# Patient Record
Sex: Male | Born: 1970
Health system: Southern US, Community
[De-identification: ages and names within clinical notes are randomized; demographics above are authoritative.]

## PROBLEM LIST (undated history)

## (undated) DIAGNOSIS — I10 Essential (primary) hypertension: Secondary | ICD-10-CM

## (undated) HISTORY — DX: Essential (primary) hypertension: I10

## (undated) HISTORY — PX: VARICOCELE EXCISION: SUR582

---

## 2005-04-02 ENCOUNTER — Ambulatory Visit: Payer: Self-pay

## 2005-04-21 ENCOUNTER — Emergency Department: Payer: Self-pay | Admitting: Unknown Physician Specialty

## 2005-10-22 ENCOUNTER — Ambulatory Visit: Payer: Self-pay | Admitting: Chiropractic Medicine

## 2005-10-24 ENCOUNTER — Emergency Department: Payer: Self-pay | Admitting: Emergency Medicine

## 2005-10-24 ENCOUNTER — Other Ambulatory Visit: Payer: Self-pay

## 2005-11-06 ENCOUNTER — Encounter: Admission: RE | Admit: 2005-11-06 | Discharge: 2005-11-06 | Payer: Self-pay | Admitting: Orthopedic Surgery

## 2005-11-20 ENCOUNTER — Encounter: Admission: RE | Admit: 2005-11-20 | Discharge: 2005-11-20 | Payer: Self-pay | Admitting: Orthopedic Surgery

## 2005-12-03 ENCOUNTER — Encounter: Admission: RE | Admit: 2005-12-03 | Discharge: 2005-12-03 | Payer: Self-pay | Admitting: Orthopedic Surgery

## 2013-05-21 ENCOUNTER — Telehealth: Payer: Self-pay | Admitting: Internal Medicine

## 2013-05-21 NOTE — Telephone Encounter (Signed)
Michael Lane is a patient of yours and he recommended you as a dr for [name]  He has bcbs.  [name] wanted to know if he could be set up as a new patient

## 2013-05-21 NOTE — Telephone Encounter (Signed)
Ok to schedule.

## 2013-05-26 NOTE — Telephone Encounter (Signed)
Patient aware of appointment on 08/21/13

## 2013-08-21 ENCOUNTER — Telehealth: Payer: Self-pay | Admitting: Internal Medicine

## 2013-08-21 ENCOUNTER — Encounter: Payer: Self-pay | Admitting: Internal Medicine

## 2013-08-21 ENCOUNTER — Ambulatory Visit (INDEPENDENT_AMBULATORY_CARE_PROVIDER_SITE_OTHER): Payer: BC Managed Care – PPO | Admitting: Internal Medicine

## 2013-08-21 VITALS — BP 178/140 | HR 84 | Temp 97.8°F | Ht 71.5 in | Wt 237.0 lb

## 2013-08-21 DIAGNOSIS — F17200 Nicotine dependence, unspecified, uncomplicated: Secondary | ICD-10-CM

## 2013-08-21 DIAGNOSIS — Z720 Tobacco use: Secondary | ICD-10-CM

## 2013-08-21 DIAGNOSIS — Z13220 Encounter for screening for lipoid disorders: Secondary | ICD-10-CM

## 2013-08-21 DIAGNOSIS — Z125 Encounter for screening for malignant neoplasm of prostate: Secondary | ICD-10-CM

## 2013-08-21 DIAGNOSIS — R9431 Abnormal electrocardiogram [ECG] [EKG]: Secondary | ICD-10-CM

## 2013-08-21 DIAGNOSIS — I10 Essential (primary) hypertension: Secondary | ICD-10-CM

## 2013-08-21 LAB — CBC WITH DIFFERENTIAL/PLATELET
Basophils Absolute: 0 10*3/uL (ref 0.0–0.1)
Basophils Relative: 0.5 % (ref 0.0–3.0)
Eosinophils Absolute: 0.1 10*3/uL (ref 0.0–0.7)
Lymphocytes Relative: 37 % (ref 12.0–46.0)
MCV: 93.2 fl (ref 78.0–100.0)
Monocytes Relative: 6.7 % (ref 3.0–12.0)
Neutrophils Relative %: 54.1 % (ref 43.0–77.0)
Platelets: 252 10*3/uL (ref 150.0–400.0)
RBC: 5.57 Mil/uL (ref 4.22–5.81)

## 2013-08-21 LAB — LDL CHOLESTEROL, DIRECT: Direct LDL: 160.9 mg/dL

## 2013-08-21 LAB — COMPREHENSIVE METABOLIC PANEL
Albumin: 4.2 g/dL (ref 3.5–5.2)
Calcium: 9.5 mg/dL (ref 8.4–10.5)
Chloride: 102 mEq/L (ref 96–112)
Potassium: 5 mEq/L (ref 3.5–5.1)
Total Bilirubin: 0.6 mg/dL (ref 0.3–1.2)

## 2013-08-21 LAB — URINALYSIS, ROUTINE W REFLEX MICROSCOPIC
Hgb urine dipstick: NEGATIVE
Ketones, ur: NEGATIVE
Leukocytes, UA: NEGATIVE
Nitrite: NEGATIVE
Specific Gravity, Urine: 1.02 (ref 1.000–1.030)
Urine Glucose: NEGATIVE
Urobilinogen, UA: 1 (ref 0.0–1.0)

## 2013-08-21 LAB — LIPID PANEL
Cholesterol: 214 mg/dL — ABNORMAL HIGH (ref 0–200)
Triglycerides: 89 mg/dL (ref 0.0–149.0)

## 2013-08-21 MED ORDER — LISINOPRIL-HYDROCHLOROTHIAZIDE 10-12.5 MG PO TABS: 1.0000 | ORAL_TABLET | Freq: Every day | ORAL | Status: DC

## 2013-08-21 NOTE — Telephone Encounter (Signed)
LVM for patient to call our office in reference to apt scheduled with [facility], [address] [place], on October 28th at 8am.

## 2013-08-23 ENCOUNTER — Encounter: Payer: Self-pay | Admitting: Internal Medicine

## 2013-08-23 DIAGNOSIS — I10 Essential (primary) hypertension: Secondary | ICD-10-CM | POA: Insufficient documentation

## 2013-08-23 DIAGNOSIS — R9431 Abnormal electrocardiogram [ECG] [EKG]: Secondary | ICD-10-CM | POA: Insufficient documentation

## 2013-08-23 DIAGNOSIS — Z720 Tobacco use: Secondary | ICD-10-CM | POA: Insufficient documentation

## 2013-08-23 NOTE — Assessment & Plan Note (Signed)
Discussed the need to quit and treatment options.

## 2013-08-23 NOTE — Assessment & Plan Note (Signed)
Blood pressure significantly elevated.  Currently without symptoms.  EKG obtained given long reported history of untreated elevated blood pressure.  EKG revealed SR with TWI anterolateral.  LVH.  Will obtain ECHO to further evaluate wall thickness, wall motion, heart function and any valve abnormality.  Start lisinopril/hctz 10/12.5 q day.  Get him back in soon to reassess.  Check metabolic panel and urinalysis.

## 2013-08-23 NOTE — Progress Notes (Signed)
  Subjective:    Patient ID: Michael Lane, male    DOB: 1971/09/13, 42 y.o.   MRN: [id number]  HPI 42 year old male with past history of elevated blood pressure on no medications.  He comes in today to follow up on this as well as to establish care.  States he is very active.  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  No chest pain or tightness with increased activity or exertion.  Breathing stable.  No acid reflux.  No nausea or vomiting.  No bowel change.  Does smoke.  Discussed the need to quit.  Discussed treatment options.  No urinary change.  He does report being on blood pressure medication on one occasion.  This affected his ability to sustain/obtain an erection, so he stopped.  Drinks a lot of caffeine.     Past Medical History  Diagnosis Date  . Hypertension     Review of Systems Patient denies any headache, lightheadedness or dizziness.  No sinus or allergy symptoms currently.  No chest pain, tightness or palpitations.  No increased shortness of breath, cough or congestion.  No nausea or vomiting.  No acid reflux.  No abdominal pain or cramping.  No bowel change, such as diarrhea, constipation, BRBPR or melana.  No urine change.   Feels he is handling his work situation well.  Handling stress well.  Smokes.  Discussed stopping and treatment options.       Objective:   Physical Exam Filed Vitals:   08/21/13 0841  BP: 178/140  Pulse: 84  Temp: 97.8 F (36.6 C)   Blood pressure rechecked by me:  178/61-66  42 year old male in no acute distress.  HEENT:  Nares - clear.  Oropharynx - without lesions. NECK:  Supple.  Nontender.  No audible carotid bruit.  HEART:  Appears to be regular.   LUNGS:  No crackles or wheezing audible.  Respirations even and unlabored.   RADIAL PULSE:  Equal bilaterally.  ABDOMEN:  Soft.  Nontender.  Bowel sounds present and normal.  No audible abdominal bruit.    EXTREMITIES:  No increased edema present.  DP pulses palpable and equal  bilaterally.          Assessment & Plan:  HEALTH MAINTENANCE.  Schedule him for a physical next visit.  Check cholesterol.   I spent 45 minutes with the patient and more than 50% of the time was spent in consultation regarding the above.

## 2013-08-23 NOTE — Assessment & Plan Note (Signed)
EKG as outlined.  Obtain ECHO for reasons given.

## 2013-08-25 ENCOUNTER — Encounter: Payer: Self-pay | Admitting: Internal Medicine

## 2013-08-25 ENCOUNTER — Telehealth: Payer: Self-pay | Admitting: Internal Medicine

## 2013-08-25 ENCOUNTER — Other Ambulatory Visit: Payer: Self-pay | Admitting: Internal Medicine

## 2013-08-25 DIAGNOSIS — E875 Hyperkalemia: Secondary | ICD-10-CM

## 2013-08-25 DIAGNOSIS — R945 Abnormal results of liver function studies: Secondary | ICD-10-CM

## 2013-08-25 NOTE — Telephone Encounter (Signed)
Pt was notified of lab results via my chart.  He needs a non fasting lab appt within the next 2 weeks.  Please contact him with an appt date and time.   Thanks.

## 2013-08-25 NOTE — Progress Notes (Signed)
Order placed for f/u labs.  

## 2013-08-26 ENCOUNTER — Encounter: Payer: Self-pay | Admitting: Emergency Medicine

## 2013-08-26 NOTE — Telephone Encounter (Signed)
Mychart mess sent to pt to inform of apt

## 2013-08-26 NOTE — Telephone Encounter (Signed)
Appointment  For labs 11/5 pt aware

## 2013-09-01 ENCOUNTER — Other Ambulatory Visit: Payer: Self-pay

## 2013-09-01 ENCOUNTER — Other Ambulatory Visit (INDEPENDENT_AMBULATORY_CARE_PROVIDER_SITE_OTHER): Payer: BC Managed Care – PPO

## 2013-09-01 DIAGNOSIS — R9431 Abnormal electrocardiogram [ECG] [EKG]: Secondary | ICD-10-CM

## 2013-09-08 ENCOUNTER — Telehealth: Payer: Self-pay | Admitting: Internal Medicine

## 2013-09-08 DIAGNOSIS — I517 Cardiomegaly: Secondary | ICD-10-CM

## 2013-09-08 DIAGNOSIS — R9431 Abnormal electrocardiogram [ECG] [EKG]: Secondary | ICD-10-CM

## 2013-09-08 NOTE — Telephone Encounter (Signed)
Pt notified of echo results and the need for cardiology referral.  He is in agreement.  Order placed for cardiology referral - Dr Kirke Corin.

## 2013-09-09 ENCOUNTER — Other Ambulatory Visit (INDEPENDENT_AMBULATORY_CARE_PROVIDER_SITE_OTHER): Payer: BC Managed Care – PPO

## 2013-09-09 ENCOUNTER — Encounter: Payer: Self-pay | Admitting: Internal Medicine

## 2013-09-09 DIAGNOSIS — R7989 Other specified abnormal findings of blood chemistry: Secondary | ICD-10-CM

## 2013-09-09 DIAGNOSIS — E875 Hyperkalemia: Secondary | ICD-10-CM

## 2013-09-09 DIAGNOSIS — R945 Abnormal results of liver function studies: Secondary | ICD-10-CM

## 2013-09-09 LAB — HEPATIC FUNCTION PANEL: Albumin: 4 g/dL (ref 3.5–5.2)

## 2013-09-09 LAB — HEMOGLOBIN: Hemoglobin: 16.7 g/dL (ref 13.0–17.0)

## 2013-09-10 ENCOUNTER — Encounter: Payer: Self-pay | Admitting: Cardiovascular Disease

## 2013-09-10 ENCOUNTER — Ambulatory Visit (INDEPENDENT_AMBULATORY_CARE_PROVIDER_SITE_OTHER): Payer: BC Managed Care – PPO | Admitting: Cardiovascular Disease

## 2013-09-10 ENCOUNTER — Other Ambulatory Visit: Payer: Self-pay

## 2013-09-10 VITALS — BP 172/123 | HR 82 | Ht 72.0 in | Wt 236.5 lb

## 2013-09-10 DIAGNOSIS — I119 Hypertensive heart disease without heart failure: Secondary | ICD-10-CM

## 2013-09-10 DIAGNOSIS — I10 Essential (primary) hypertension: Secondary | ICD-10-CM

## 2013-09-10 DIAGNOSIS — E785 Hyperlipidemia, unspecified: Secondary | ICD-10-CM

## 2013-09-10 MED ORDER — AMLODIPINE BESYLATE 5 MG PO TABS: 5.0000 mg | ORAL_TABLET | Freq: Every day | ORAL | Status: DC

## 2013-09-10 MED ORDER — LISINOPRIL-HYDROCHLOROTHIAZIDE 20-25 MG PO TABS: 1.0000 | ORAL_TABLET | Freq: Every day | ORAL | Status: DC

## 2013-09-10 NOTE — Assessment & Plan Note (Signed)
Lab Results  Component Value Date   CHOL 214* 08/21/2013   HDL 44.30 08/21/2013   LDLDIRECT 160.9 08/21/2013   TRIG 89.0 08/21/2013   CHOLHDL 5 08/21/2013   Lifestyle changes are recommended for the time being. If no significant improvement, a statin might be indicated.

## 2013-09-10 NOTE — Assessment & Plan Note (Signed)
His echo is highly suggestive of hypertensive heart disease which is concerning in a patient at this age. He does have extensive anterolateral T wave changes. Thus, I will plan on obtaining a stress test once his blood pressure is controlled.

## 2013-09-10 NOTE — Assessment & Plan Note (Signed)
The patient has malignant hypertension with end organ damage with chronic manifestations. It is concerning that his blood pressure is still extremely high. I had a prolonged discussion with him about my concerns of continuously having such high blood pressure and associated morbidity with this. I discussed with him the importance of low sodium diet and decreasing caffeine intake. I also think we have to rule out secondary hypertension given his young age with extremely high blood pressure. His symptoms are not suggestive of pheochromocytoma. Distal pulses are intact and thus coarctation is unlikely. I will obtain blood work including cortisol level and aldosterone to renin ratio. I will also request a renal artery duplex ultrasound to rule out renal artery stenosis.  In the meanwhile, I will double the dose of lisinopril hydrochlorothiazide and add amlodipine 5 mg once daily.  I will have him followup with me after testing.  I reviewed his recent blood work which was unremarkable except for abnormal liver enzymes. He is going to followup with Dr. Lorin Picket about this. He reports no history of hepatitis and no excessive alcohol abuse. He does have tattoos and thus hepatitis C should be evaluated.

## 2013-09-10 NOTE — Patient Instructions (Signed)
Labs today.   Your physician has requested that you have a renal artery duplex. During this test, an ultrasound is used to evaluate blood flow to the kidneys. Allow one hour for this exam. Do not eat after midnight the day before and avoid carbonated beverages. Take your medications as you usually do.  Increase Lisinopril-Hydrochlorothiazide to 20-25 mg once daily.   Start Amlodipine 5 mg once daily.   Follow up after tests.

## 2013-09-10 NOTE — Progress Notes (Signed)
Primary care physician: Dr. Dale Diggins  HPI  This is a 42 year old Caucasian man who was referred by Dr. Lorin Picket for evaluation of hypertension and abnormal EKG. The patient reports having high blood pressure for at least the last 2-3 years. His blood pressure has been consistently above 160 systolic. He was placed on a medication in the past but stopped taking it due to erectile dysfunction. He was started recently on small dose lisinopril hydrochlorothiazide with blood pressure is still very elevated. He is completely asymptomatic and denies any symptoms related to high blood pressure. He also denies any chest pain, dyspnea, orthopnea or PND. He used to exercise intensely on a regular basis but slowed down the summer due to getting busy at work. He runs 3 small businesses. He smokes one pack per day and drinks alcohol socially. He denies any recreational drug use or any supplements use. He has no history of sleep apnea and has no symptoms suggestive of that. He reports excellent quality of sleep. His dad has known history of hypertension but not sure if he had high blood pressure at  early age. There is no family history of ischemic heart disease. He had an echocardiogram done which showed moderate left ventricular hypertrophy, normal LV systolic function and mildly dilated aortic root.  Allergies  Allergen Reactions  . Codeine Nausea And Vomiting  . Shrimp [Shellfish Allergy]      No current outpatient prescriptions on file prior to visit.   No current facility-administered medications on file prior to visit.     Past Medical History  Diagnosis Date  . Hypertension      Past Surgical History  Procedure Laterality Date  . Varicocele excision       Family History  Problem Relation Age of Onset  . Arthritis Mother   . Cancer Mother     breast  . Breast cancer Mother   . Kidney disease Father     H/O kidney stones  . Hypertension Father      History   Social History    . Marital Status: Single    Spouse Name: N/A    Number of Children: N/A  . Years of Education: N/A   Occupational History  . Not on file.   Social History Main Topics  . Smoking status: Current Every Day Smoker -- 1.00 packs/day for 25 years    Types: Cigarettes  . Smokeless tobacco: Never Used     Comment: 1 ppd  . Alcohol Use: No  . Drug Use: No     Comment: past  . Sexual Activity: Not on file   Other Topics Concern  . Not on file   Social History Narrative  . No narrative on file     ROS A 10 point review of system was performed. It is negative other than that mentioned in the history of present illness.   PHYSICAL EXAM   BP 172/123  Pulse 82  Ht 6' (1.829 m)  Wt 236 lb 8 oz (107.276 kg)  BMI 32.07 kg/m2 Constitutional: He is oriented to person, place, and time. He appears well-developed and well-nourished. No distress.  HENT: No nasal discharge.  Head: Normocephalic and atraumatic.  Eyes: Pupils are equal and round.  No discharge. Neck: Normal range of motion. Neck supple. No JVD present. No thyromegaly present.  Cardiovascular: Normal rate, regular rhythm, normal heart sounds. Exam reveals no gallop and no friction rub. No murmur heard.  Pulmonary/Chest: Effort normal and breath sounds normal. No  stridor. No respiratory distress. He has no wheezes. He has no rales. He exhibits no tenderness.  Abdominal: Soft. Bowel sounds are normal. He exhibits no distension. There is no tenderness. There is no rebound and no guarding.  Musculoskeletal: Normal range of motion. He exhibits no edema and no tenderness.  Neurological: He is alert and oriented to person, place, and time. Coordination normal.  Skin: Skin is warm and dry. No rash noted. He is not diaphoretic. No erythema. No pallor.  Psychiatric: He has a normal mood and affect. His behavior is normal. Judgment and thought content normal.  Normal distal pulses. No bruits throughout     EKG: Recent EKG was  reviewed which showed sinus rhythm with left enlargement, left ventricular hypertrophy with extensive T wave changes in the anterolateral leads.   ASSESSMENT AND PLAN

## 2013-09-11 ENCOUNTER — Ambulatory Visit (INDEPENDENT_AMBULATORY_CARE_PROVIDER_SITE_OTHER): Payer: BC Managed Care – PPO | Admitting: Internal Medicine

## 2013-09-11 ENCOUNTER — Encounter: Payer: Self-pay | Admitting: Internal Medicine

## 2013-09-11 VITALS — BP 130/90 | HR 82 | Temp 98.2°F | Wt 237.0 lb

## 2013-09-11 DIAGNOSIS — F17200 Nicotine dependence, unspecified, uncomplicated: Secondary | ICD-10-CM

## 2013-09-11 DIAGNOSIS — I119 Hypertensive heart disease without heart failure: Secondary | ICD-10-CM

## 2013-09-11 DIAGNOSIS — E785 Hyperlipidemia, unspecified: Secondary | ICD-10-CM

## 2013-09-11 DIAGNOSIS — R945 Abnormal results of liver function studies: Secondary | ICD-10-CM

## 2013-09-11 DIAGNOSIS — R7989 Other specified abnormal findings of blood chemistry: Secondary | ICD-10-CM

## 2013-09-11 DIAGNOSIS — Z720 Tobacco use: Secondary | ICD-10-CM

## 2013-09-11 DIAGNOSIS — I10 Essential (primary) hypertension: Secondary | ICD-10-CM

## 2013-09-11 LAB — CREATININE, SERUM: Creatinine, Ser: 0.9 mg/dL (ref 0.4–1.5)

## 2013-09-11 NOTE — Progress Notes (Signed)
Pre-visit discussion using our clinic review tool. No additional management support is needed unless otherwise documented below in the visit note.  

## 2013-09-12 LAB — HEPATITIS B CORE ANTIBODY, TOTAL: Hep B Core Total Ab: NONREACTIVE

## 2013-09-12 LAB — HEPATITIS B SURFACE ANTIGEN: Hepatitis B Surface Ag: NEGATIVE

## 2013-09-13 ENCOUNTER — Encounter: Payer: Self-pay | Admitting: Internal Medicine

## 2013-09-13 DIAGNOSIS — R945 Abnormal results of liver function studies: Secondary | ICD-10-CM | POA: Insufficient documentation

## 2013-09-13 NOTE — Progress Notes (Signed)
  Subjective:    Patient ID: Michael Lane, male    DOB: December 15, 1970, 42 y.o.   MRN: [id number]  HPI 42 year old male with past history of elevated blood pressure on no medications.  He comes in today for a scheduled follow up.   He is very active.  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  No chest pain or tightness with increased activity or exertion.  Breathing stable.  No acid reflux.  No nausea or vomiting.  No bowel change.  Does smoke. Discussed the need to quit.  Have discussed treatment options.  No urinary change.  Last visit was started on blood pressure medication.  Saw Dr Kirke Corin.  See his note for details.  Schedule for a stress test and for renal artery duplex.  His lisinopril/hctz was doubled and amlodipine was added.  He returns today for f/u of his blood pressure.  Tolerating the medication well.  He has cut back on his caffeine.  Trying to adjust his diet.      Past Medical History  Diagnosis Date  . Hypertension     Current Outpatient Prescriptions on File Prior to Visit  Medication Sig Dispense Refill  . amLODipine (NORVASC) 5 MG tablet Take 1 tablet (5 mg total) by mouth daily.  30 tablet  3  . lisinopril-hydrochlorothiazide (PRINZIDE,ZESTORETIC) 20-25 MG per tablet Take 1 tablet by mouth daily.  30 tablet  3   No current facility-administered medications on file prior to visit.    Review of Systems Patient denies any headache, lightheadedness or dizziness.  No sinus or allergy symptoms currently.  No chest pain, tightness or palpitations.  No increased shortness of breath, cough or congestion.  No nausea or vomiting.  No acid reflux.  No abdominal pain or cramping.  No bowel change, such as diarrhea, constipation, BRBPR or melana.  No urine change.   Feels he is handling his work situation well.  Handling stress well.  Smokes.  Discussed stopping and treatment options.  On blood pressure medication now and tolerating.       Objective:   Physical Exam  Filed  Vitals:   09/11/13 0913  BP: 130/90  Pulse: 82  Temp: 98.2 F (36.8 C)   Blood pressure rechecked by me:  73/3  42 year old male in no acute distress.  HEENT:  Nares - clear.  Oropharynx - without lesions. NECK:  Supple.  Nontender.  No audible carotid bruit.  HEART:  Appears to be regular.   LUNGS:  No crackles or wheezing audible.  Respirations even and unlabored.   RADIAL PULSE:  Equal bilaterally.  ABDOMEN:  Soft.  Nontender.  Bowel sounds present and normal.  No audible abdominal bruit.    EXTREMITIES:  No increased edema present.  DP pulses palpable and equal bilaterally.          Assessment & Plan:  HEALTH MAINTENANCE.  Schedule him for a physical next visit.

## 2013-09-13 NOTE — Assessment & Plan Note (Addendum)
On lisinopril/hctz and amlodipine.  Blood pressure is better.  Follow.  Check metabolic panel today.  Planning for renal artery duplex.

## 2013-09-13 NOTE — Assessment & Plan Note (Signed)
Low cholesterol diet and exercise.  Follow.   

## 2013-09-13 NOTE — Assessment & Plan Note (Signed)
Planning for stress test soon.  Seeing Dr Kirke Corin.

## 2013-09-13 NOTE — Assessment & Plan Note (Signed)
Discussed the need to quit and treatment options.   

## 2013-09-13 NOTE — Assessment & Plan Note (Signed)
Recent check improved.  Will check hepatitis panel.  Follow.  Will need abdominal ultrasound in the future.

## 2013-09-15 ENCOUNTER — Encounter: Payer: Self-pay | Admitting: Internal Medicine

## 2013-09-18 ENCOUNTER — Telehealth: Payer: Self-pay

## 2013-09-18 NOTE — Telephone Encounter (Signed)
Message copied by Marilynne Halsted on Fri Sep 18, 2013 10:55 AM ------      Message from: Lorine Bears A      Created: Thu Sep 17, 2013  4:11 PM       Inform patient that labs for hypertension were normal. ------

## 2013-09-18 NOTE — Telephone Encounter (Signed)
Spoke w/ pt.  He is aware of results.  

## 2013-09-23 ENCOUNTER — Encounter (INDEPENDENT_AMBULATORY_CARE_PROVIDER_SITE_OTHER): Payer: BC Managed Care – PPO

## 2013-09-23 DIAGNOSIS — I10 Essential (primary) hypertension: Secondary | ICD-10-CM

## 2013-09-25 ENCOUNTER — Telehealth: Payer: Self-pay

## 2013-09-25 NOTE — Telephone Encounter (Signed)
Spoke w/ pt.  He is aware of results.  

## 2013-09-25 NOTE — Telephone Encounter (Signed)
Message copied by Marilynne Halsted on Fri Sep 25, 2013  9:32 AM ------      Message from: Lorine Bears A      Created: Thu Sep 24, 2013  9:22 AM       Normal abdominal aorta with no evidence of renal artery stenosis. ------

## 2013-09-28 ENCOUNTER — Encounter: Payer: Self-pay | Admitting: Cardiovascular Disease

## 2013-09-28 ENCOUNTER — Ambulatory Visit (INDEPENDENT_AMBULATORY_CARE_PROVIDER_SITE_OTHER): Payer: BC Managed Care – PPO | Admitting: Cardiovascular Disease

## 2013-09-28 ENCOUNTER — Ambulatory Visit: Payer: BC Managed Care – PPO | Admitting: Cardiovascular Disease

## 2013-09-28 VITALS — BP 170/108 | HR 78 | Ht 72.0 in | Wt 239.5 lb

## 2013-09-28 DIAGNOSIS — R9431 Abnormal electrocardiogram [ECG] [EKG]: Secondary | ICD-10-CM

## 2013-09-28 DIAGNOSIS — I10 Essential (primary) hypertension: Secondary | ICD-10-CM

## 2013-09-28 MED ORDER — LOSARTAN POTASSIUM-HCTZ 100-25 MG PO TABS: 1.0000 | ORAL_TABLET | Freq: Every day | ORAL | Status: DC

## 2013-09-28 MED ORDER — AMLODIPINE BESYLATE 10 MG PO TABS: 10.0000 mg | ORAL_TABLET | Freq: Every day | ORAL | Status: DC

## 2013-09-28 NOTE — Progress Notes (Signed)
Primary care physician: Dr. Dale Evanston  HPI  This is a 42 year old Caucasian man who is here today for a followup visit regarding malignant hypertension and  abnormal EKG. The patient reports having high blood pressure for at least the last 2-3 years. His blood pressure has been consistently above 160 systolic. He was placed on a medication in the past but stopped taking it due to erectile dysfunction. He was started recently on small dose lisinopril hydrochlorothiazide with blood pressure is still very elevated. He is completely asymptomatic and denies any symptoms related to high blood pressure. He also denies any chest pain, dyspnea, orthopnea or PND. He used to exercise intensely on a regular basis but slowed down the summer due to getting busy at work. He runs 3 small businesses. He smokes one pack per day and drinks alcohol socially. He denies any recreational drug use or any supplements use. He has no history of sleep apnea and has no symptoms suggestive of that. He reports excellent quality of sleep. His dad has known history of hypertension but not sure if he had high blood pressure at  early age. There is no family history of ischemic heart disease. He had an echocardiogram done which showed moderate left ventricular hypertrophy, normal LV systolic function and mildly dilated aortic root. Blood pressure slightly improved after increasing the dose of lisinopril/hydrochlorothiazide and adding amlodipine. I performed a workup for secondary hypertension which was nonrevealing including normal cortisol level, normal aldosterone to renin ratio and no evidence of renal artery stenosis on ultrasound. He feels that his blood pressure has improved but he gets nervous at the physician's office.  Allergies  Allergen Reactions  . Codeine Nausea And Vomiting  . Shrimp [Shellfish Allergy]      Current Outpatient Prescriptions on File Prior to Visit  Medication Sig Dispense Refill  . amLODipine  (NORVASC) 5 MG tablet Take 1 tablet (5 mg total) by mouth daily.  30 tablet  3  . lisinopril-hydrochlorothiazide (PRINZIDE,ZESTORETIC) 20-25 MG per tablet Take 1 tablet by mouth daily.  30 tablet  3   No current facility-administered medications on file prior to visit.     Past Medical History  Diagnosis Date  . Hypertension      Past Surgical History  Procedure Laterality Date  . Varicocele excision       Family History  Problem Relation Age of Onset  . Arthritis Mother   . Cancer Mother     breast  . Breast cancer Mother   . Kidney disease Father     H/O kidney stones  . Hypertension Father      History   Social History  . Marital Status: Single    Spouse Name: N/A    Number of Children: N/A  . Years of Education: N/A   Occupational History  . Not on file.   Social History Main Topics  . Smoking status: Current Every Day Smoker -- 1.00 packs/day for 25 years    Types: Cigarettes  . Smokeless tobacco: Never Used     Comment: 1 ppd  . Alcohol Use: No  . Drug Use: No     Comment: past  . Sexual Activity: Not on file   Other Topics Concern  . Not on file   Social History Narrative  . No narrative on file     ROS A 10 point review of system was performed. It is negative other than that mentioned in the history of present illness.   PHYSICAL EXAM  BP 170/108  Pulse 78  Ht 6' (1.829 m)  Wt 239 lb 8 oz (108.636 kg)  BMI 32.47 kg/m2 Constitutional: He is oriented to person, place, and time. He appears well-developed and well-nourished. No distress.  HENT: No nasal discharge.  Head: Normocephalic and atraumatic.  Eyes: Pupils are equal and round.  No discharge. Neck: Normal range of motion. Neck supple. No JVD present. No thyromegaly present.  Cardiovascular: Normal rate, regular rhythm, normal heart sounds. Exam reveals no gallop and no friction rub. No murmur heard.  Pulmonary/Chest: Effort normal and breath sounds normal. No stridor. No  respiratory distress. He has no wheezes. He has no rales. He exhibits no tenderness.  Abdominal: Soft. Bowel sounds are normal. He exhibits no distension. There is no tenderness. There is no rebound and no guarding.  Musculoskeletal: Normal range of motion. He exhibits no edema and no tenderness.  Neurological: He is alert and oriented to person, place, and time. Coordination normal.  Skin: Skin is warm and dry. No rash noted. He is not diaphoretic. No erythema. No pallor.  Psychiatric: He has a normal mood and affect. His behavior is normal. Judgment and thought content normal.  Normal distal pulses. No bruits throughout      ASSESSMENT AND PLAN

## 2013-09-28 NOTE — Patient Instructions (Signed)
Make the following changes to your medication:  1. Stop Lisinopril-Hydrochlorothiazide.  2. Start Losartan- Hydrochlorothiazide 100/25 mg once daily.  3. Increase Amlodipine to 10 mg once daily.   Follow up in 2 months.

## 2013-09-28 NOTE — Assessment & Plan Note (Addendum)
Workup for secondary hypertension is negative. He continues to have significantly high blood pressure. I recommend increasing amlodipine to 10 mg once daily then switching lisinopril/hydrochlorothiazide to losartan/hydrochlorothiazide 100/25 mg once daily. This is primarily to be able to use the higher dose of the combination.  I do feel that stress is contributing to his hypertension and this was discussed with him today extensively. I advised him to consider obtaining a blood pressure machine in order to check blood pressure at home.

## 2013-09-28 NOTE — Assessment & Plan Note (Signed)
I suspect that the EKG changes are likely due to hypertensive heart disease. I am planning to obtain a stress test once his blood pressure is controlled.

## 2013-11-11 ENCOUNTER — Telehealth: Payer: Self-pay | Admitting: Internal Medicine

## 2013-11-11 NOTE — Telephone Encounter (Signed)
Patient Information:  Caller Name: Tien  Phone: [phone]  Patient: Michael Lane, Michael Lane  Gender: Male  DOB: July 23, 1971  Age: 43 Years  PCP: Einar Pheasant  Office Follow Up:  Does the office need to follow up with this patient?: No  Instructions For The Office: N/A  RN Note:  Hydrate & humidfy.  May use Guaifenesin prn to loosen cough.  Stop using otc combination product.  Symptoms  Reason For Call & Symptoms: Suspected influenza symptoms with body aches, felt feverish with some chills, mild headache, and chest congestion. No known influenza exposure but other employees have been sick with respiratory symptoms  Reviewed Health History In EMR: Yes  Reviewed Medications In EMR: Yes  Reviewed Allergies In EMR: Yes  Reviewed Surgeries / Procedures: Yes  Date of Onset of Symptoms: 11/03/2013  Treatments Tried: Tylenol Cold & Flu, > orange juice, chicken noodle soup  Treatments Tried Worked: Yes  Guideline(s) Used:  Influenza - Seasonal  Disposition Per Guideline:   Home Care  Reason For Disposition Reached:   Probable influenza with no complications and not HIGH RISK  Advice Given:  Reassurance  For most healthy adults, influenza feels like a bad cold. The dangers of influenza for normal, healthy people (under 62 years of age) are overrated.  The treatment of influenza depends on your main symptoms. Generally, treatment is the same as for other viral respiratory infections (colds). Bed rest is unnecessary.  Here is some care advice that should help.  Treating the Symptoms of Flu  Fever, Muscle Aches, and Headache: For fever more than 101 F (38.3 C), muscle aches, and headaches, take acetaminophen every 4-6 hours (Adults 650 mg) OR ibuprofen every 6-8 hours (Adults 400-600 mg).  Sore Throat: Use throat lozenges, hard candy or warm chicken broth.  Cough: Use cough drops.  Hydrate: Drink extra liquids. If the air in your home is dry, use a humidifier.  No Aspirin  : Do not  use aspirin for treatment of fever or pain (Reason: there is an association between influenza and Reye syndrome).  Isolation is Needed Until After the Fever is Gone:   The CDC recommends that people with influenza-like illness remain at home until at least 24 hours after they are free of fever (100 F or 37.8C).  Do NOT go to work or school.  Do NOT go to church, child care centers, shopping, or other public places.  Do NOT shake hands.  Avoid close contact with others (hugging, kissing).  Expected Course  : The fever lasts 2-3 days, the runny nose 5-10 days, and the cough 2-3 weeks.  Call Back If:  Fever lasts more than 3 days  Runny nose lasts more than 10 days  Cough lasts more than 3 weeks  You become short of breath or worse.  Patient Will Follow Care Advice:  YES

## 2013-12-03 ENCOUNTER — Ambulatory Visit: Payer: BC Managed Care – PPO | Admitting: Cardiovascular Disease

## 2014-04-19 ENCOUNTER — Encounter: Payer: Self-pay | Admitting: Internal Medicine

## 2014-04-19 ENCOUNTER — Ambulatory Visit: Payer: Self-pay | Admitting: Internal Medicine

## 2014-04-19 ENCOUNTER — Ambulatory Visit (INDEPENDENT_AMBULATORY_CARE_PROVIDER_SITE_OTHER): Payer: BC Managed Care – PPO | Admitting: Internal Medicine

## 2014-04-19 VITALS — BP 140/90 | HR 85 | Temp 98.3°F | Ht 72.0 in | Wt 245.0 lb

## 2014-04-19 DIAGNOSIS — L989 Disorder of the skin and subcutaneous tissue, unspecified: Secondary | ICD-10-CM

## 2014-04-19 DIAGNOSIS — E785 Hyperlipidemia, unspecified: Secondary | ICD-10-CM

## 2014-04-19 DIAGNOSIS — Z720 Tobacco use: Secondary | ICD-10-CM

## 2014-04-19 DIAGNOSIS — F17200 Nicotine dependence, unspecified, uncomplicated: Secondary | ICD-10-CM

## 2014-04-19 DIAGNOSIS — I10 Essential (primary) hypertension: Secondary | ICD-10-CM

## 2014-04-19 DIAGNOSIS — R9431 Abnormal electrocardiogram [ECG] [EKG]: Secondary | ICD-10-CM

## 2014-04-19 DIAGNOSIS — R945 Abnormal results of liver function studies: Secondary | ICD-10-CM

## 2014-04-19 DIAGNOSIS — F439 Reaction to severe stress, unspecified: Secondary | ICD-10-CM

## 2014-04-19 DIAGNOSIS — R7989 Other specified abnormal findings of blood chemistry: Secondary | ICD-10-CM

## 2014-04-19 DIAGNOSIS — Z733 Stress, not elsewhere classified: Secondary | ICD-10-CM

## 2014-04-19 NOTE — Progress Notes (Signed)
Pre visit review using our clinic review tool, if applicable. No additional management support is needed unless otherwise documented below in the visit note. 

## 2014-04-26 ENCOUNTER — Other Ambulatory Visit: Payer: Self-pay

## 2014-04-27 ENCOUNTER — Telehealth: Payer: Self-pay | Admitting: *Deleted

## 2014-04-27 DIAGNOSIS — R945 Abnormal results of liver function studies: Secondary | ICD-10-CM

## 2014-04-27 DIAGNOSIS — E785 Hyperlipidemia, unspecified: Secondary | ICD-10-CM

## 2014-04-27 DIAGNOSIS — R7989 Other specified abnormal findings of blood chemistry: Secondary | ICD-10-CM

## 2014-04-27 DIAGNOSIS — I10 Essential (primary) hypertension: Secondary | ICD-10-CM

## 2014-04-27 DIAGNOSIS — D582 Other hemoglobinopathies: Secondary | ICD-10-CM

## 2014-04-27 NOTE — Telephone Encounter (Signed)
Pt is coming in tomorrow what labs and dx?  

## 2014-04-27 NOTE — Telephone Encounter (Signed)
Orders placed for labs

## 2014-04-28 ENCOUNTER — Other Ambulatory Visit (INDEPENDENT_AMBULATORY_CARE_PROVIDER_SITE_OTHER): Payer: BC Managed Care – PPO

## 2014-04-28 ENCOUNTER — Encounter: Payer: Self-pay | Admitting: Internal Medicine

## 2014-04-28 ENCOUNTER — Telehealth: Payer: Self-pay | Admitting: Internal Medicine

## 2014-04-28 DIAGNOSIS — R7989 Other specified abnormal findings of blood chemistry: Secondary | ICD-10-CM

## 2014-04-28 DIAGNOSIS — R945 Abnormal results of liver function studies: Secondary | ICD-10-CM

## 2014-04-28 DIAGNOSIS — D582 Other hemoglobinopathies: Secondary | ICD-10-CM

## 2014-04-28 DIAGNOSIS — R739 Hyperglycemia, unspecified: Secondary | ICD-10-CM

## 2014-04-28 DIAGNOSIS — E785 Hyperlipidemia, unspecified: Secondary | ICD-10-CM

## 2014-04-28 DIAGNOSIS — I10 Essential (primary) hypertension: Secondary | ICD-10-CM

## 2014-04-28 LAB — LIPID PANEL
CHOLESTEROL: 174 mg/dL (ref 0–200)
HDL: 44.5 mg/dL (ref >39.00)
LDL Cholesterol: 114 mg/dL — ABNORMAL HIGH (ref 0–99)
NONHDL: 129.5
TRIGLYCERIDES: 79 mg/dL (ref 0.0–149.0)
Total CHOL/HDL Ratio: 4
VLDL: 15.8 mg/dL (ref 0.0–40.0)

## 2014-04-28 LAB — CBC WITH DIFFERENTIAL/PLATELET
Basophils Absolute: 0 10*3/uL (ref 0.0–0.1)
Basophils Relative: 0.4 % (ref 0.0–3.0)
EOS ABS: 0.1 10*3/uL (ref 0.0–0.7)
EOS PCT: 1.4 % (ref 0.0–5.0)
HCT: 44.5 % (ref 39.0–52.0)
HEMOGLOBIN: 15.1 g/dL (ref 13.0–17.0)
LYMPHS ABS: 3 10*3/uL (ref 0.7–4.0)
LYMPHS PCT: 48.6 % — AB (ref 12.0–46.0)
MCHC: 33.9 g/dL (ref 30.0–36.0)
MCV: 93.9 fl (ref 78.0–100.0)
MONO ABS: 0.4 10*3/uL (ref 0.1–1.0)
MONOS PCT: 5.7 % (ref 3.0–12.0)
Neutro Abs: 2.7 10*3/uL (ref 1.4–7.7)
Neutrophils Relative %: 43.9 % (ref 43.0–77.0)
PLATELETS: 303 10*3/uL (ref 150.0–400.0)
RBC: 4.73 Mil/uL (ref 4.22–5.81)
RDW: 13.6 % (ref 11.5–15.5)
WBC: 6.2 10*3/uL (ref 4.0–10.5)

## 2014-04-28 LAB — HEPATIC FUNCTION PANEL
ALT: 138 U/L — ABNORMAL HIGH (ref 0–53)
AST: 65 U/L — ABNORMAL HIGH (ref 0–37)
Albumin: 4.3 g/dL (ref 3.5–5.2)
Alkaline Phosphatase: 47 U/L (ref 39–117)
Bilirubin, Direct: 0.1 mg/dL (ref 0.0–0.3)
Total Bilirubin: 0.5 mg/dL (ref 0.2–1.2)
Total Protein: 6.9 g/dL (ref 6.0–8.3)

## 2014-04-28 LAB — BASIC METABOLIC PANEL WITH GFR
BUN: 14 mg/dL (ref 6–23)
CO2: 29 meq/L (ref 19–32)
Calcium: 9.8 mg/dL (ref 8.4–10.5)
Chloride: 102 meq/L (ref 96–112)
Creatinine, Ser: 1.1 mg/dL (ref 0.4–1.5)
GFR: 80.93 mL/min
Glucose, Bld: 147 mg/dL — ABNORMAL HIGH (ref 70–99)
Potassium: 3.9 meq/L (ref 3.5–5.1)
Sodium: 139 meq/L (ref 135–145)

## 2014-04-28 NOTE — Telephone Encounter (Signed)
Pt notified of lab results via my chart.  Notified of need for fasting lab within 1-2 weeks.  Please schedule a fasting lab appt and contact him with the appt date and time.  Thanks.   Dr Nicki Reaper

## 2014-04-30 NOTE — Telephone Encounter (Signed)
Unread mychart message mailed to patient 

## 2014-05-01 ENCOUNTER — Encounter: Payer: Self-pay | Admitting: Internal Medicine

## 2014-05-01 DIAGNOSIS — L989 Disorder of the skin and subcutaneous tissue, unspecified: Secondary | ICD-10-CM | POA: Insufficient documentation

## 2014-05-01 DIAGNOSIS — F439 Reaction to severe stress, unspecified: Secondary | ICD-10-CM | POA: Insufficient documentation

## 2014-05-01 NOTE — Progress Notes (Signed)
  Subjective:    Patient ID: Michael Lane, male    DOB: 04/03/1971, 43 y.o.   MRN: [id number]  HPI 43 year old male with past history of hypertension.   He comes in today for a scheduled follow up.   He is very active.  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  No chest pain or tightness with increased activity or exertion.  Breathing stable.  No acid reflux.  No nausea or vomiting.  No bowel change.  Has quit smoking.   No urinary change.  He returns today for f/u of his blood pressure.  Tolerating the medication well.  He has cut back on his caffeine.  Trying to adjust his diet.  Is questioning if he could have ADHD.  States he is always "wired".  Goes "strong" all of the time.     Past Medical History  Diagnosis Date  . Hypertension     Current Outpatient Prescriptions on File Prior to Visit  Medication Sig Dispense Refill  . amLODipine (NORVASC) 10 MG tablet Take 1 tablet (10 mg total) by mouth daily.  30 tablet  6  . losartan-hydrochlorothiazide (HYZAAR) 100-25 MG per tablet Take 1 tablet by mouth daily.  30 tablet  6   No current facility-administered medications on file prior to visit.    Review of Systems Patient denies any headache, lightheadedness or dizziness.  No sinus or allergy symptoms currently.  No chest pain, tightness or palpitations.  No increased shortness of breath, cough or congestion.  No nausea or vomiting.  No acid reflux.  No abdominal pain or cramping.  No bowel change, such as diarrhea, constipation, BRBPR or melana.  No urine change.   Feels he is handling his work situation relatively well.  Handling stress relatively well.  Is "wide open" all of the time.  Was questioning ADHD.         Objective:   Physical Exam  Filed Vitals:   04/19/14 1334  BP: 140/90  Pulse: 85  Temp: 98.3 F (36.8 C)   Blood pressure rechecked by me:  132/82, pulse 92  43 year old male in no acute distress.  HEENT:  Nares - clear.  Oropharynx - without  lesions. NECK:  Supple.  Nontender.  No audible carotid bruit.  HEART:  Appears to be regular.   LUNGS:  No crackles or wheezing audible.  Respirations even and unlabored.   RADIAL PULSE:  Equal bilaterally.  ABDOMEN:  Soft.  Nontender.  Bowel sounds present and normal.  No audible abdominal bruit.    EXTREMITIES:  No increased edema present.  DP pulses palpable and equal bilaterally.          Assessment & Plan:  HEALTH MAINTENANCE.  Schedule him for a physical next visit.

## 2014-05-01 NOTE — Assessment & Plan Note (Signed)
Saw cardiology.  See Dr Tyrell Antonio note.  Cardiac testing ok.  Follow.

## 2014-05-01 NOTE — Assessment & Plan Note (Signed)
Blood pressure doing much better on current regimen.  Check metabolic panel.  Same medications.

## 2014-05-01 NOTE — Assessment & Plan Note (Signed)
Increased stress.  "wide open" all of the time.  Was questioning ADHD.  Will schedule an evaluation with Dr Nicolasa Ducking.  Follow.

## 2014-05-01 NOTE — Assessment & Plan Note (Signed)
Low cholesterol diet and exercise.  Follow.  Check lipid panel with next labs.

## 2014-05-01 NOTE — Assessment & Plan Note (Signed)
Hepatitis panel negative.  Recheck liver panel with next labs.  Will need abdominal ultrasound in the future.

## 2014-05-01 NOTE — Assessment & Plan Note (Signed)
Has quit smoking.  Doing well.  Plans to decrease E cigarettes.

## 2014-05-01 NOTE — Assessment & Plan Note (Signed)
Persistent lesion under right eye and right side of nose.  Refer to Dr Nehemiah Massed for further evaluation.

## 2014-05-11 ENCOUNTER — Other Ambulatory Visit: Payer: BC Managed Care – PPO

## 2014-06-08 DIAGNOSIS — C4491 Basal cell carcinoma of skin, unspecified: Secondary | ICD-10-CM

## 2014-06-08 HISTORY — DX: Basal cell carcinoma of skin, unspecified: C44.91

## 2014-06-22 ENCOUNTER — Other Ambulatory Visit: Payer: Self-pay | Admitting: Cardiovascular Disease

## 2014-07-06 ENCOUNTER — Other Ambulatory Visit: Payer: Self-pay | Admitting: Cardiovascular Disease

## 2014-07-08 ENCOUNTER — Other Ambulatory Visit: Payer: Self-pay | Admitting: *Deleted

## 2014-07-08 ENCOUNTER — Telehealth: Payer: Self-pay | Admitting: Internal Medicine

## 2014-07-08 MED ORDER — LOSARTAN POTASSIUM-HCTZ 100-25 MG PO TABS: 1.0000 | ORAL_TABLET | Freq: Every day | ORAL | Status: DC

## 2014-07-08 MED ORDER — AMLODIPINE BESYLATE 10 MG PO TABS: 10.0000 mg | ORAL_TABLET | Freq: Every day | ORAL | Status: DC

## 2014-07-08 NOTE — Telephone Encounter (Signed)
Rx sent to [facility]

## 2014-07-08 NOTE — Telephone Encounter (Signed)
Patient left voicemail asking for a call back. He states he was called about 3 weeks ago about a referral to psychiatry. He asked for a refill during that phone call for his two blood pressure medication. He states he has also contacted his pharmacy and requested the medication.  I advised patient that I would talk with Dr. Nicki Reaper about his two medications getting them refilled to [facility] on [address]. Please let me know once these have been sent in so I can contact patient back.

## 2014-07-08 NOTE — Telephone Encounter (Signed)
I spoke with patient and he is aware that his medication has been sent to pharmacy. I also apologized for the delay.

## 2014-08-20 ENCOUNTER — Encounter: Payer: Self-pay | Admitting: Internal Medicine

## 2014-08-20 ENCOUNTER — Ambulatory Visit (INDEPENDENT_AMBULATORY_CARE_PROVIDER_SITE_OTHER): Payer: BC Managed Care – PPO | Admitting: Internal Medicine

## 2014-08-20 VITALS — BP 130/78 | HR 80 | Temp 98.2°F | Ht 72.0 in | Wt 252.0 lb

## 2014-08-20 DIAGNOSIS — E785 Hyperlipidemia, unspecified: Secondary | ICD-10-CM

## 2014-08-20 DIAGNOSIS — Z658 Other specified problems related to psychosocial circumstances: Secondary | ICD-10-CM

## 2014-08-20 DIAGNOSIS — R945 Abnormal results of liver function studies: Secondary | ICD-10-CM

## 2014-08-20 DIAGNOSIS — R739 Hyperglycemia, unspecified: Secondary | ICD-10-CM

## 2014-08-20 DIAGNOSIS — L989 Disorder of the skin and subcutaneous tissue, unspecified: Secondary | ICD-10-CM

## 2014-08-20 DIAGNOSIS — R7989 Other specified abnormal findings of blood chemistry: Secondary | ICD-10-CM

## 2014-08-20 DIAGNOSIS — F439 Reaction to severe stress, unspecified: Secondary | ICD-10-CM

## 2014-08-20 DIAGNOSIS — L918 Other hypertrophic disorders of the skin: Secondary | ICD-10-CM

## 2014-08-20 DIAGNOSIS — I10 Essential (primary) hypertension: Secondary | ICD-10-CM

## 2014-08-20 LAB — HEPATIC FUNCTION PANEL
ALT: 163 U/L — ABNORMAL HIGH (ref 0–53)
AST: 101 U/L — ABNORMAL HIGH (ref 0–37)
Albumin: 3.7 g/dL (ref 3.5–5.2)
Alkaline Phosphatase: 49 U/L (ref 39–117)
Bilirubin, Direct: 0 mg/dL (ref 0.0–0.3)
Total Bilirubin: 0.8 mg/dL (ref 0.2–1.2)
Total Protein: 7.7 g/dL (ref 6.0–8.3)

## 2014-08-20 LAB — BASIC METABOLIC PANEL
BUN: 11 mg/dL (ref 6–23)
CO2: 26 meq/L (ref 19–32)
Calcium: 9.5 mg/dL (ref 8.4–10.5)
Chloride: 101 mEq/L (ref 96–112)
Creatinine, Ser: 1.1 mg/dL (ref 0.4–1.5)
GFR: 77.43 mL/min (ref >60.00)
Glucose, Bld: 138 mg/dL — ABNORMAL HIGH (ref 70–99)
Potassium: 3.9 mEq/L (ref 3.5–5.1)
SODIUM: 136 meq/L (ref 135–145)

## 2014-08-20 LAB — HEMOGLOBIN A1C: Hgb A1c MFr Bld: 7 % — ABNORMAL HIGH (ref 4.6–6.5)

## 2014-08-20 NOTE — Progress Notes (Signed)
Pre visit review using our clinic review tool, if applicable. No additional management support is needed unless otherwise documented below in the visit note. 

## 2014-08-21 ENCOUNTER — Encounter: Payer: Self-pay | Admitting: Internal Medicine

## 2014-08-21 DIAGNOSIS — L918 Other hypertrophic disorders of the skin: Secondary | ICD-10-CM | POA: Insufficient documentation

## 2014-08-21 NOTE — Progress Notes (Signed)
Subjective:    Patient ID: Michael Lane, male    DOB: [birth year], 43 y.o.   MRN: [id number]  HPI 43 year old male with past history of hypertension.   He comes in today for a scheduled follow up.   He is very active.  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  No chest pain or tightness with increased activity or exertion.  Breathing stable.  No acid reflux.  No nausea or vomiting.  No bowel change.  Has quit smoking.   No urinary change.  He returns today for f/u of his blood pressure.  Tolerating the medication well.  Blood pressure doing better. States his blood pressures are averaging 130s/70-80.  Has multiple skin tags.  Wants earlier appt with Dr Nehemiah Massed.  Found to have elevated liver function tests.  Discussed diet and exercise.     Past Medical History  Diagnosis Date  . Hypertension     Current Outpatient Prescriptions on File Prior to Visit  Medication Sig Dispense Refill  . amLODipine (NORVASC) 10 MG tablet Take 1 tablet (10 mg total) by mouth daily.  30 tablet  5  . losartan-hydrochlorothiazide (HYZAAR) 100-25 MG per tablet Take 1 tablet by mouth daily.  30 tablet  5   No current facility-administered medications on file prior to visit.    Review of Systems Patient denies any headache, lightheadedness or dizziness.  No sinus or allergy symptoms currently.  No chest pain, tightness or palpitations.  No increased shortness of breath, cough or congestion.  No nausea or vomiting.  No acid reflux.  No abdominal pain or cramping.  No bowel change, such as diarrhea, constipation, BRBPR or melana.  No urine change.   Feels he is handling his work situation relatively well.  Increased stress.  Handling stress relatively well.  Did see Dr Nicolasa Ducking.  Planning to see a Social worker.  Skin tags as outlined.       Objective:   Physical Exam  Filed Vitals:   08/20/14 1035  BP: 130/78  Pulse: 80  Temp: 98.2 F (36.8 C)   Blood pressure rechecked by me:  31/51  43 year old male  in no acute distress.  HEENT:  Nares - clear.  Oropharynx - without lesions. NECK:  Supple.  Nontender.  No audible carotid bruit.  HEART:  Appears to be regular.   LUNGS:  No crackles or wheezing audible.  Respirations even and unlabored.   RADIAL PULSE:  Equal bilaterally.  ABDOMEN:  Soft.  Nontender.  Bowel sounds present and normal.  No audible abdominal bruit.    EXTREMITIES:  No increased edema present.  DP pulses palpable and equal bilaterally.          Assessment & Plan:  HEALTH MAINTENANCE.  Schedule him for a physical next visit.  He declined today.   Problem List Items Addressed This Visit   Abnormal liver function tests     Hepatitis panel negative.  Recheck liver panel with next labs.  Will need abdominal ultrasound in the future.       Relevant Orders      Lipid panel   Cutaneous skin tags     Request earlier appt with dermatology.       Essential hypertension, benign     Blood pressure doing much better on current regimen.  Check metabolic panel.  Same medications.       Hyperlipidemia     Low cholesterol diet and exercise.  Follow.  Check  lipid panel with next labs.       Skin lesion     Has multiple skin tags.  Request earlier appt with Dr Nehemiah Massed.      Stress     Increased stress as outlined.  Saw Dr Nicolasa Ducking.  Desires no medication.  Due to see a counselor soon.        Other Visit Diagnoses   Essential hypertension    -  Primary    Relevant Orders       Basic metabolic panel (Completed)    Hyperglycemia          I spent 25 minutes with the patient and more than 50% of the time was spent in consultation regarding the above.

## 2014-08-21 NOTE — Assessment & Plan Note (Signed)
Has multiple skin tags.  Request earlier appt with Dr Nehemiah Massed.

## 2014-08-21 NOTE — Assessment & Plan Note (Signed)
Blood pressure doing much better on current regimen.  Check metabolic panel.  Same medications.

## 2014-08-21 NOTE — Assessment & Plan Note (Signed)
Hepatitis panel negative.  Recheck liver panel with next labs.  Will need abdominal ultrasound in the future.

## 2014-08-21 NOTE — Assessment & Plan Note (Signed)
Low cholesterol diet and exercise.  Follow.  Check lipid panel with next labs.

## 2014-08-21 NOTE — Assessment & Plan Note (Signed)
Increased stress as outlined.  Saw Dr Nicolasa Ducking.  Desires no medication.  Due to see a counselor soon.

## 2014-08-21 NOTE — Assessment & Plan Note (Signed)
Request earlier appt with dermatology.

## 2014-08-23 ENCOUNTER — Encounter: Payer: Self-pay | Admitting: *Deleted

## 2014-08-23 ENCOUNTER — Other Ambulatory Visit: Payer: Self-pay | Admitting: Internal Medicine

## 2014-08-23 DIAGNOSIS — R7989 Other specified abnormal findings of blood chemistry: Secondary | ICD-10-CM

## 2014-08-23 DIAGNOSIS — R945 Abnormal results of liver function studies: Secondary | ICD-10-CM

## 2014-08-23 NOTE — Progress Notes (Signed)
Order placed for abdominal ultrasound.   

## 2014-08-26 ENCOUNTER — Ambulatory Visit: Payer: Self-pay | Admitting: Internal Medicine

## 2014-08-31 ENCOUNTER — Encounter: Payer: Self-pay | Admitting: Internal Medicine

## 2014-08-31 ENCOUNTER — Ambulatory Visit (INDEPENDENT_AMBULATORY_CARE_PROVIDER_SITE_OTHER): Payer: BC Managed Care – PPO | Admitting: Internal Medicine

## 2014-08-31 VITALS — BP 130/80 | HR 87 | Temp 98.0°F | Ht 72.0 in | Wt 252.5 lb

## 2014-08-31 DIAGNOSIS — I10 Essential (primary) hypertension: Secondary | ICD-10-CM

## 2014-08-31 DIAGNOSIS — R945 Abnormal results of liver function studies: Secondary | ICD-10-CM

## 2014-08-31 DIAGNOSIS — R7989 Other specified abnormal findings of blood chemistry: Secondary | ICD-10-CM

## 2014-08-31 DIAGNOSIS — E119 Type 2 diabetes mellitus without complications: Secondary | ICD-10-CM

## 2014-08-31 NOTE — Progress Notes (Signed)
Pre visit review using our clinic review tool, if applicable. No additional management support is needed unless otherwise documented below in the visit note. 

## 2014-09-05 ENCOUNTER — Encounter: Payer: Self-pay | Admitting: Internal Medicine

## 2014-09-05 DIAGNOSIS — E119 Type 2 diabetes mellitus without complications: Secondary | ICD-10-CM | POA: Insufficient documentation

## 2014-09-05 NOTE — Progress Notes (Signed)
  Subjective:    Patient ID: Michael Lane, male    DOB: Mar 24, 1971, 43 y.o.   MRN: [id number]  HPI 43 year old male with past history of hypertension.   He comes in today as a work in to discuss recent labs.  Sugar is elevated.  Liver function tests increasing.   He is very active.  Lifts weights. Runs two businesses. No chest pain or tightness with increased activity or exertion.  Breathing stable.  No acid reflux.  No nausea or vomiting.  No bowel change.  Since finding out about his sugars, he has adjusted his diet and is exercising.  Feels better.  Has cut out soft drinks.  Has cut back on carbs.     Past Medical History  Diagnosis Date  . Hypertension     Current Outpatient Prescriptions on File Prior to Visit  Medication Sig Dispense Refill  . amLODipine (NORVASC) 10 MG tablet Take 1 tablet (10 mg total) by mouth daily. 30 tablet 5  . losartan-hydrochlorothiazide (HYZAAR) 100-25 MG per tablet Take 1 tablet by mouth daily. 30 tablet 5   No current facility-administered medications on file prior to visit.    Review of Systems Patient denies any headache, lightheadedness or dizziness.  No sinus or allergy symptoms currently.  No chest pain, tightness or palpitations.  No increased shortness of breath.  No abdominal pain or cramping.  Saw the counselor.  Going well.  Sugar elevated on recent labs.  We discussed diet and exercise.  Discussed diabetes.  He declines Lifestyles.  Discussed elevated liver function tests and ultrasound results.        Objective:   Physical Exam  Filed Vitals:   08/31/14 1211  BP: 130/80  Pulse: 87  Temp: 98 F (18.37 C)   43 year old male in no acute distress.  HEENT:  Nares - clear.  Oropharynx - without lesions. NECK:  Supple.  Nontender.  No audible carotid bruit.  HEART:  Appears to be regular.   LUNGS:  No crackles or wheezing audible.  Respirations even and unlabored.   RADIAL PULSE:  Equal bilaterally.  ABDOMEN:  Soft.  Nontender.  Bowel  sounds present and normal.  No audible abdominal bruit.    EXTREMITIES:  No increased edema present.  DP pulses palpable and equal bilaterally.          Assessment & Plan:  HEALTH MAINTENANCE.  Schedule him for a physical next visit.  He declined today.   Essential hypertension, benign Blood pressure doing well.  Same medication regimen.  Follow.    Abnormal liver function tests Recent liver tests elevated.  Increased from previous checks.  He is exercising now.  Adjusting his diet.  Discussed sugars.  Abdominal US revealed diffuse parenchymal disease possibly steatosis.  After discussion, will refer to GI for further evaluation and work up.  Has had hepatitis A and B vaccines.    Type 2 diabetes mellitus without complication Just recently diagnosed.  A1c checked - 7.0.  Low carb diet.  Declines Lifestyles.  Given a glucometer.  Instructed on how to check sugars.  Follow.  Continue exercise.    I spent 25 minutes with the patient and more than 50% of the time was spent in consultation regarding the above.

## 2014-10-27 ENCOUNTER — Ambulatory Visit (INDEPENDENT_AMBULATORY_CARE_PROVIDER_SITE_OTHER): Payer: BC Managed Care – PPO | Admitting: Internal Medicine

## 2014-10-27 ENCOUNTER — Encounter: Payer: Self-pay | Admitting: Internal Medicine

## 2014-10-27 VITALS — BP 136/87 | HR 80 | Temp 97.7°F | Ht 72.0 in | Wt 255.0 lb

## 2014-10-27 DIAGNOSIS — I10 Essential (primary) hypertension: Secondary | ICD-10-CM

## 2014-10-27 DIAGNOSIS — E785 Hyperlipidemia, unspecified: Secondary | ICD-10-CM

## 2014-10-27 DIAGNOSIS — Z125 Encounter for screening for malignant neoplasm of prostate: Secondary | ICD-10-CM

## 2014-10-27 DIAGNOSIS — E119 Type 2 diabetes mellitus without complications: Secondary | ICD-10-CM

## 2014-10-27 DIAGNOSIS — R945 Abnormal results of liver function studies: Secondary | ICD-10-CM

## 2014-10-27 DIAGNOSIS — R7989 Other specified abnormal findings of blood chemistry: Secondary | ICD-10-CM

## 2014-10-27 DIAGNOSIS — E669 Obesity, unspecified: Secondary | ICD-10-CM

## 2014-10-27 NOTE — Progress Notes (Signed)
Pre visit review using our clinic review tool, if applicable. No additional management support is needed unless otherwise documented below in the visit note. 

## 2014-10-28 ENCOUNTER — Ambulatory Visit: Payer: BC Managed Care – PPO | Admitting: Internal Medicine

## 2014-11-01 ENCOUNTER — Encounter: Payer: Self-pay | Admitting: Internal Medicine

## 2014-11-01 DIAGNOSIS — Z6835 Body mass index (BMI) 35.0-35.9, adult: Secondary | ICD-10-CM | POA: Insufficient documentation

## 2014-11-01 NOTE — Progress Notes (Signed)
  Subjective:    Patient ID: Michael Lane, male    DOB: 20-Mar-1971, 43 y.o.   MRN: [id number]  HPI 43 year old male with past history of hypertension.   He comes in today for a scheduled follow up.  Sugar was elevated.  Liver function tests increasing.  See last note for details.  He has adjusted his diet. Is exercising.  Feels better.  Has more energy.   He is very active.  Lifts weights. Runs two businesses. No chest pain or tightness with increased activity or exertion.  Breathing stable.  No acid reflux.  No nausea or vomiting.  No bowel change.  Since finding out about his sugars, he has adjusted his diet and is exercising.  Has cut out soft drinks.  Has cut back on carbs.  Decrease fast food.  Doing well.     Past Medical History  Diagnosis Date  . Hypertension     Current Outpatient Prescriptions on File Prior to Visit  Medication Sig Dispense Refill  . amLODipine (NORVASC) 10 MG tablet Take 1 tablet (10 mg total) by mouth daily. 30 tablet 5  . losartan-hydrochlorothiazide (HYZAAR) 100-25 MG per tablet Take 1 tablet by mouth daily. 30 tablet 5   No current facility-administered medications on file prior to visit.    Review of Systems Patient denies any headache, lightheadedness or dizziness.  No sinus or allergy symptoms currently.  No chest pain, tightness or palpitations.  No increased shortness of breath.  No abdominal pain or cramping.  Sugar elevated on recent labs.  We discussed diet and exercise.  He is doing well.  Has adjusted his diet and is exercising.  See above.  Feels better.         Objective:   Physical Exam  Filed Vitals:   10/27/14 1224  BP: 136/87  Pulse: 80  Temp: 97.7 F (69.40 C)   43 year old male in no acute distress.  HEENT:  Nares - clear.  Oropharynx - without lesions. NECK:  Supple.  Nontender.  No audible carotid bruit.  HEART:  Appears to be regular.   LUNGS:  No crackles or wheezing audible.  Respirations even and unlabored.   RADIAL PULSE:   Equal bilaterally.  ABDOMEN:  Soft.  Nontender.  Bowel sounds present and normal.  No audible abdominal bruit.    EXTREMITIES:  No increased edema present.  DP pulses palpable and equal bilaterally.          Assessment & Plan:  1. Obesity (BMI 30-39.9) Diet and exercise.    2. Essential hypertension, benign Blood pressure doing well.  Same medication regimen.   - Basic metabolic panel; Future  3. Type 2 diabetes mellitus without complication Has adjusted his diet and is exercising.  Doing well.  Feels better.  - Hemoglobin A1c; Future - TSH; Future - Microalbumin / creatinine urine ratio; Future  4. Hyperlipidemia Low cholesterol diet and exercise.   - Lipid panel; Future  5. Abnormal liver function tests Has adjusted his diet.  Is exercising.   - Hepatic function panel; Future  6. Prostate cancer screening - PSA; Future  HEALTH MAINTENANCE.  Schedule him for a physical next visit.

## 2014-11-30 ENCOUNTER — Encounter: Payer: Self-pay | Admitting: Internal Medicine

## 2015-01-05 ENCOUNTER — Encounter: Payer: Self-pay | Admitting: Internal Medicine

## 2015-01-05 ENCOUNTER — Other Ambulatory Visit (INDEPENDENT_AMBULATORY_CARE_PROVIDER_SITE_OTHER): Payer: BLUE CROSS/BLUE SHIELD

## 2015-01-05 DIAGNOSIS — I10 Essential (primary) hypertension: Secondary | ICD-10-CM

## 2015-01-05 DIAGNOSIS — R945 Abnormal results of liver function studies: Secondary | ICD-10-CM

## 2015-01-05 DIAGNOSIS — E119 Type 2 diabetes mellitus without complications: Secondary | ICD-10-CM

## 2015-01-05 DIAGNOSIS — E785 Hyperlipidemia, unspecified: Secondary | ICD-10-CM

## 2015-01-05 DIAGNOSIS — Z125 Encounter for screening for malignant neoplasm of prostate: Secondary | ICD-10-CM

## 2015-01-05 DIAGNOSIS — R7989 Other specified abnormal findings of blood chemistry: Secondary | ICD-10-CM

## 2015-01-05 LAB — HEPATIC FUNCTION PANEL
ALK PHOS: 50 U/L (ref 39–117)
ALT: 113 U/L — AB (ref 0–53)
AST: 57 U/L — ABNORMAL HIGH (ref 0–37)
Albumin: 4.4 g/dL (ref 3.5–5.2)
Bilirubin, Direct: 0.1 mg/dL (ref 0.0–0.3)
TOTAL PROTEIN: 7 g/dL (ref 6.0–8.3)
Total Bilirubin: 0.6 mg/dL (ref 0.2–1.2)

## 2015-01-05 LAB — MICROALBUMIN / CREATININE URINE RATIO
CREATININE, U: 154.5 mg/dL
MICROALB/CREAT RATIO: 0.5 mg/g (ref 0.0–30.0)
Microalb, Ur: <0.7 mg/dL (ref 0.0–1.9)

## 2015-01-05 LAB — LIPID PANEL
CHOL/HDL RATIO: 4
Cholesterol: 170 mg/dL (ref 0–200)
HDL: 42.4 mg/dL (ref >39.00)
LDL CALC: 110 mg/dL — AB (ref 0–99)
NonHDL: 127.6
TRIGLYCERIDES: 86 mg/dL (ref 0.0–149.0)
VLDL: 17.2 mg/dL (ref 0.0–40.0)

## 2015-01-05 LAB — BASIC METABOLIC PANEL
BUN: 13 mg/dL (ref 6–23)
CHLORIDE: 102 meq/L (ref 96–112)
CO2: 30 meq/L (ref 19–32)
Calcium: 9.7 mg/dL (ref 8.4–10.5)
Creatinine, Ser: 1.03 mg/dL (ref 0.40–1.50)
GFR: 83.38 mL/min (ref >60.00)
Glucose, Bld: 126 mg/dL — ABNORMAL HIGH (ref 70–99)
POTASSIUM: 4.9 meq/L (ref 3.5–5.1)
Sodium: 140 mEq/L (ref 135–145)

## 2015-01-05 LAB — HEMOGLOBIN A1C: HEMOGLOBIN A1C: 6.5 % (ref 4.6–6.5)

## 2015-01-05 LAB — PSA: PSA: 0.51 ng/mL (ref 0.10–4.00)

## 2015-01-05 LAB — TSH: TSH: 1.71 u[IU]/mL (ref 0.35–4.50)

## 2015-01-07 ENCOUNTER — Ambulatory Visit (INDEPENDENT_AMBULATORY_CARE_PROVIDER_SITE_OTHER): Payer: BLUE CROSS/BLUE SHIELD | Admitting: Internal Medicine

## 2015-01-07 ENCOUNTER — Encounter: Payer: Self-pay | Admitting: Internal Medicine

## 2015-01-07 VITALS — BP 136/80 | HR 78 | Temp 98.1°F | Ht 72.0 in | Wt 256.0 lb

## 2015-01-07 DIAGNOSIS — R7989 Other specified abnormal findings of blood chemistry: Secondary | ICD-10-CM

## 2015-01-07 DIAGNOSIS — E119 Type 2 diabetes mellitus without complications: Secondary | ICD-10-CM

## 2015-01-07 DIAGNOSIS — Z0000 Encounter for general adult medical examination without abnormal findings: Secondary | ICD-10-CM

## 2015-01-07 DIAGNOSIS — R945 Abnormal results of liver function studies: Secondary | ICD-10-CM

## 2015-01-07 DIAGNOSIS — I10 Essential (primary) hypertension: Secondary | ICD-10-CM

## 2015-01-07 DIAGNOSIS — E669 Obesity, unspecified: Secondary | ICD-10-CM

## 2015-01-07 DIAGNOSIS — F439 Reaction to severe stress, unspecified: Secondary | ICD-10-CM

## 2015-01-07 DIAGNOSIS — E785 Hyperlipidemia, unspecified: Secondary | ICD-10-CM

## 2015-01-07 DIAGNOSIS — Z658 Other specified problems related to psychosocial circumstances: Secondary | ICD-10-CM

## 2015-01-07 NOTE — Progress Notes (Signed)
Pre visit review using our clinic review tool, if applicable. No additional management support is needed unless otherwise documented below in the visit note. 

## 2015-01-07 NOTE — Progress Notes (Signed)
Patient ID: Michael Lane, male   DOB: Jun 21, 1971, 44 y.o.   MRN: [id number]   Subjective:    Patient ID: Michael Lane, male    DOB: [birth year], 44 y.o.   MRN: [id number]  HPI  Patient here for a scheduled physical.  Has adjusted his diet.  Is exercising.  Goes to the gym 4-5x/week.  Feels better.  Sugars have improved.  LFTs improved.  No cardiac symptoms with increased activity or exertion.  Breathing doing well.     Past Medical History  Diagnosis Date  . Hypertension     Current Outpatient Prescriptions on File Prior to Visit  Medication Sig Dispense Refill  . amLODipine (NORVASC) 10 MG tablet Take 1 tablet (10 mg total) by mouth daily. 30 tablet 5  . losartan-hydrochlorothiazide (HYZAAR) 100-25 MG per tablet Take 1 tablet by mouth daily. 30 tablet 5   No current facility-administered medications on file prior to visit.    Review of Systems  Constitutional: Negative for appetite change and unexpected weight change.  HENT: Negative for congestion and sinus pressure.   Eyes: Negative for visual disturbance.  Respiratory: Negative for cough, chest tightness and shortness of breath.   Cardiovascular: Negative for chest pain, palpitations and leg swelling.  Gastrointestinal: Positive for constipation. Negative for nausea, vomiting, abdominal pain and diarrhea.  Genitourinary: Negative for frequency and difficulty urinating.  Musculoskeletal: Negative for back pain and joint swelling.  Skin: Negative for color change and rash.  Neurological: Negative for dizziness, light-headedness and headaches.  Hematological: Negative for adenopathy. Does not bruise/bleed easily.  Psychiatric/Behavioral: Negative for dysphoric mood and agitation.       Objective:     Blood pressure recheck:  136/82-84  Physical Exam  Constitutional: He is oriented to person, place, and time. He appears well-developed and well-nourished. No distress.  HENT:  Nose: Nose normal.  Mouth/Throat: Oropharynx  is clear and moist. No oropharyngeal exudate.  Eyes: Conjunctivae are normal. Right eye exhibits no discharge. Left eye exhibits no discharge.  Neck: Neck supple. No thyromegaly present.  Cardiovascular: Normal rate and regular rhythm.   Pulmonary/Chest: Breath sounds normal. No respiratory distress. He has no wheezes.  Abdominal: Soft. Bowel sounds are normal. There is no tenderness.  Genitourinary:  Normal descended testicles.  No palpable nodules.  Rectal exam reveals no palpable prostate nodules.  Heme negative.    Musculoskeletal: He exhibits no edema or tenderness.  Lymphadenopathy:    He has no cervical adenopathy.  Neurological: He is alert and oriented to person, place, and time.  Skin: No rash noted. No erythema.  Psychiatric: He has a normal mood and affect. His behavior is normal.    BP 136/80 mmHg  Pulse 78  Temp(Src) 98.1 F (36.7 C) (Oral)  Ht 6' (1.829 m)  Wt 256 lb (116.121 kg)  BMI 34.71 kg/m2  SpO2 94% Wt Readings from Last 3 Encounters:  01/07/15 256 lb (116.121 kg)  10/27/14 255 lb (115.667 kg)  08/31/14 252 lb 8 oz (114.533 kg)     Lab Results  Component Value Date   WBC 6.2 04/28/2014   HGB 15.1 04/28/2014   HCT 44.5 04/28/2014   PLT 303.0 04/28/2014   GLUCOSE 126* 01/05/2015   CHOL 170 01/05/2015   TRIG 86.0 01/05/2015   HDL 42.40 01/05/2015   LDLDIRECT 160.9 08/21/2013   LDLCALC 110* 01/05/2015   ALT 113* 01/05/2015   AST 57* 01/05/2015   NA 140 01/05/2015   K 4.9 01/05/2015   CL  102 01/05/2015   CREATININE 1.03 01/05/2015   BUN 13 01/05/2015   CO2 30 01/05/2015   TSH 1.71 01/05/2015   PSA 0.51 01/05/2015   HGBA1C 6.5 01/05/2015   MICROALBUR <0.7 01/05/2015        Assessment & Plan:   Problem List Items Addressed This Visit    Abnormal liver function tests    Hepatitis panel negative.  Ultrasound revealed fatty liver (10//22/15).  Has adjusted his diet.  Is exercising.  Recent liver panel improved.  Follow.       Relevant  Orders   Hepatic function panel   Diabetes mellitus    Has adjusted his diet.  Is exercising.  Sugars improved.  A1c just checked 01/05/15 0 6.5.        Relevant Orders   CBC with Differential/Platelet   Hemoglobin A1c   Essential hypertension, benign - Primary    Blood pressure as outlined.  Continue same medication regimen.  Follow pressures.  Cr 1.03 (01/05/15).  Follow.        Relevant Orders   Basic metabolic panel   Health care maintenance    Physical today.  PSA .51 - 01/05/15.        Hyperlipidemia    Low cholesterol diet and exercise.  LDL 110.  Follow.        Relevant Orders   Lipid panel   Obesity (BMI 30-39.9)    Has adjusted his diet.  Is exercising.  Feels better.  Sugars better.  Follow.        Stress    Sees a Social worker.  Appears to be doing well.  Follow.            Einar Pheasant, MD

## 2015-01-16 ENCOUNTER — Encounter: Payer: Self-pay | Admitting: Internal Medicine

## 2015-01-16 DIAGNOSIS — Z0000 Encounter for general adult medical examination without abnormal findings: Secondary | ICD-10-CM | POA: Insufficient documentation

## 2015-01-16 NOTE — Assessment & Plan Note (Signed)
Has adjusted his diet.  Is exercising.  Sugars improved.  A1c just checked 01/05/15 0 6.5.

## 2015-01-16 NOTE — Assessment & Plan Note (Signed)
Sees a Social worker.  Appears to be doing well.  Follow.

## 2015-01-16 NOTE — Assessment & Plan Note (Signed)
Low cholesterol diet and exercise.  LDL 110.  Follow.

## 2015-01-16 NOTE — Assessment & Plan Note (Signed)
Blood pressure as outlined.  Continue same medication regimen.  Follow pressures.  Cr 1.03 (01/05/15).  Follow.

## 2015-01-16 NOTE — Assessment & Plan Note (Signed)
Hepatitis panel negative.  Ultrasound revealed fatty liver (10//22/15).  Has adjusted his diet.  Is exercising.  Recent liver panel improved.  Follow.

## 2015-01-16 NOTE — Assessment & Plan Note (Signed)
Has adjusted his diet.  Is exercising.  Feels better.  Sugars better.  Follow.

## 2015-01-16 NOTE — Assessment & Plan Note (Signed)
Physical today.  PSA .51 - 01/05/15.

## 2015-01-24 ENCOUNTER — Other Ambulatory Visit: Payer: Self-pay | Admitting: Internal Medicine

## 2015-05-02 ENCOUNTER — Other Ambulatory Visit: Payer: Self-pay

## 2015-05-10 ENCOUNTER — Other Ambulatory Visit: Payer: BLUE CROSS/BLUE SHIELD

## 2015-05-12 ENCOUNTER — Ambulatory Visit: Payer: BLUE CROSS/BLUE SHIELD | Admitting: Internal Medicine

## 2015-07-22 ENCOUNTER — Other Ambulatory Visit (INDEPENDENT_AMBULATORY_CARE_PROVIDER_SITE_OTHER): Payer: BLUE CROSS/BLUE SHIELD

## 2015-07-22 ENCOUNTER — Encounter: Payer: Self-pay | Admitting: Internal Medicine

## 2015-07-22 DIAGNOSIS — I10 Essential (primary) hypertension: Secondary | ICD-10-CM | POA: Diagnosis not present

## 2015-07-22 DIAGNOSIS — R7989 Other specified abnormal findings of blood chemistry: Secondary | ICD-10-CM | POA: Diagnosis not present

## 2015-07-22 DIAGNOSIS — E785 Hyperlipidemia, unspecified: Secondary | ICD-10-CM

## 2015-07-22 DIAGNOSIS — E119 Type 2 diabetes mellitus without complications: Secondary | ICD-10-CM

## 2015-07-22 DIAGNOSIS — R945 Abnormal results of liver function studies: Secondary | ICD-10-CM

## 2015-07-22 LAB — HEPATIC FUNCTION PANEL
ALK PHOS: 55 U/L (ref 39–117)
ALT: 109 U/L — ABNORMAL HIGH (ref 0–53)
AST: 52 U/L — ABNORMAL HIGH (ref 0–37)
Albumin: 4.4 g/dL (ref 3.5–5.2)
BILIRUBIN TOTAL: 0.8 mg/dL (ref 0.2–1.2)
Bilirubin, Direct: 0.2 mg/dL (ref 0.0–0.3)
Total Protein: 7 g/dL (ref 6.0–8.3)

## 2015-07-22 LAB — CBC WITH DIFFERENTIAL/PLATELET
BASOS PCT: 0.5 % (ref 0.0–3.0)
Basophils Absolute: 0 10*3/uL (ref 0.0–0.1)
EOS PCT: 1.2 % (ref 0.0–5.0)
Eosinophils Absolute: 0.1 10*3/uL (ref 0.0–0.7)
HCT: 46.5 % (ref 39.0–52.0)
Hemoglobin: 15.8 g/dL (ref 13.0–17.0)
LYMPHS ABS: 2.9 10*3/uL (ref 0.7–4.0)
Lymphocytes Relative: 38.7 % (ref 12.0–46.0)
MCHC: 34 g/dL (ref 30.0–36.0)
MCV: 92.6 fl (ref 78.0–100.0)
Monocytes Absolute: 0.4 10*3/uL (ref 0.1–1.0)
Monocytes Relative: 5.7 % (ref 3.0–12.0)
NEUTROS ABS: 4 10*3/uL (ref 1.4–7.7)
NEUTROS PCT: 53.9 % (ref 43.0–77.0)
PLATELETS: 324 10*3/uL (ref 150.0–400.0)
RBC: 5.02 Mil/uL (ref 4.22–5.81)
RDW: 13.6 % (ref 11.5–15.5)
WBC: 7.5 10*3/uL (ref 4.0–10.5)

## 2015-07-22 LAB — BASIC METABOLIC PANEL
BUN: 14 mg/dL (ref 6–23)
CO2: 31 mEq/L (ref 19–32)
CREATININE: 1.06 mg/dL (ref 0.40–1.50)
Calcium: 9.6 mg/dL (ref 8.4–10.5)
Chloride: 100 mEq/L (ref 96–112)
GFR: 80.47 mL/min (ref >60.00)
Glucose, Bld: 110 mg/dL — ABNORMAL HIGH (ref 70–99)
Potassium: 4.8 mEq/L (ref 3.5–5.1)
Sodium: 138 mEq/L (ref 135–145)

## 2015-07-22 LAB — HEMOGLOBIN A1C: HEMOGLOBIN A1C: 6.3 % (ref 4.6–6.5)

## 2015-07-22 LAB — LIPID PANEL
Cholesterol: 163 mg/dL (ref 0–200)
HDL: 44.8 mg/dL (ref >39.00)
LDL Cholesterol: 101 mg/dL — ABNORMAL HIGH (ref 0–99)
NONHDL: 118.57
Total CHOL/HDL Ratio: 4
Triglycerides: 87 mg/dL (ref 0.0–149.0)
VLDL: 17.4 mg/dL (ref 0.0–40.0)

## 2015-07-25 NOTE — Telephone Encounter (Signed)
Will review at this weeks appt

## 2015-07-28 ENCOUNTER — Encounter: Payer: Self-pay | Admitting: Internal Medicine

## 2015-07-28 ENCOUNTER — Ambulatory Visit (INDEPENDENT_AMBULATORY_CARE_PROVIDER_SITE_OTHER): Payer: BLUE CROSS/BLUE SHIELD | Admitting: Internal Medicine

## 2015-07-28 VITALS — BP 122/78 | HR 80 | Temp 98.0°F | Resp 18 | Ht 72.0 in | Wt 244.2 lb

## 2015-07-28 DIAGNOSIS — E785 Hyperlipidemia, unspecified: Secondary | ICD-10-CM | POA: Diagnosis not present

## 2015-07-28 DIAGNOSIS — F439 Reaction to severe stress, unspecified: Secondary | ICD-10-CM

## 2015-07-28 DIAGNOSIS — R945 Abnormal results of liver function studies: Secondary | ICD-10-CM

## 2015-07-28 DIAGNOSIS — I10 Essential (primary) hypertension: Secondary | ICD-10-CM | POA: Diagnosis not present

## 2015-07-28 DIAGNOSIS — E119 Type 2 diabetes mellitus without complications: Secondary | ICD-10-CM | POA: Diagnosis not present

## 2015-07-28 DIAGNOSIS — E669 Obesity, unspecified: Secondary | ICD-10-CM

## 2015-07-28 DIAGNOSIS — R7989 Other specified abnormal findings of blood chemistry: Secondary | ICD-10-CM | POA: Diagnosis not present

## 2015-07-28 DIAGNOSIS — Z658 Other specified problems related to psychosocial circumstances: Secondary | ICD-10-CM

## 2015-07-28 DIAGNOSIS — Z125 Encounter for screening for malignant neoplasm of prostate: Secondary | ICD-10-CM

## 2015-07-28 NOTE — Assessment & Plan Note (Signed)
Low cholesterol diet and exercise.  Cholesterol improving.  Follow lipid panel.   Lab Results  Component Value Date   CHOL 163 07/22/2015   HDL 44.80 07/22/2015   LDLCALC 101* 07/22/2015   LDLDIRECT 160.9 08/21/2013   TRIG 87.0 07/22/2015   CHOLHDL 4 07/22/2015

## 2015-07-28 NOTE — Assessment & Plan Note (Signed)
Has adjusted his diet.  Lost weight.  A1c 6.3.  Continue diet adjustment and exercise.  Follow met b and a1c.

## 2015-07-28 NOTE — Assessment & Plan Note (Signed)
Continue diet and exercise as he is doing.  Follow.

## 2015-07-28 NOTE — Assessment & Plan Note (Signed)
Doing better since out of his long term relationship.  Follow.

## 2015-07-28 NOTE — Assessment & Plan Note (Signed)
Blood pressure doing well.  Has adjusted his diet and lost weight.  Continue same medication regimen.  Follow pressures.  Follow metabolic panel.

## 2015-07-28 NOTE — Assessment & Plan Note (Signed)
Hepatitis panel negative.  Ultrasound revealed changes that appear to be c/w fatty liver.  Has adjusted diet.  Lost weight.  Liver panel improved.  Continue to follow.  Recheck liver panel next labs.  Hold on any further w/up.

## 2015-07-28 NOTE — Progress Notes (Signed)
Patient ID: Michael Lane, male   DOB: 07/04/1971, 44 y.o.   MRN: [id number]   Subjective:    Patient ID: Michael Lane, male    DOB: 1971/04/28, 44 y.o.   MRN: [id number]  HPI  Patient with past history of diabetes, hypertension and hyper cholesterolemia.  Comes in today for a follow up of these issues.  Has adjusted his diet.  Is exercising.  Losing weight. Feels better.  No chest pain or tightness reported.  No sob.  Has just recently got out of a relationship of five years.  Feels better. Feels better about himself.  No problems with urination and no problems with his bowels.     Past Medical History  Diagnosis Date  . Hypertension    Past Surgical History  Procedure Laterality Date  . Varicocele excision     Family History  Problem Relation Age of Onset  . Arthritis Mother   . Cancer Mother     breast  . Breast cancer Mother   . Kidney disease Father     H/O kidney stones  . Hypertension Father    Social History   Social History  . Marital Status: Married    Spouse Name: N/A  . Number of Children: N/A  . Years of Education: N/A   Social History Main Topics  . Smoking status: Former Smoker -- 1.00 packs/day for 25 years    Types: Cigarettes  . Smokeless tobacco: Never Used     Comment: Quit smoking in February 2015  . Alcohol Use: No  . Drug Use: No     Comment: past  . Sexual Activity: Not Asked   Other Topics Concern  . None   Social History Narrative    Outpatient Encounter Prescriptions as of 07/28/2015  Medication Sig  . amLODipine (NORVASC) 10 MG tablet TAKE 1 TABLET (10 MG TOTAL) BY MOUTH DAILY.  Marland Kitchen losartan-hydrochlorothiazide (HYZAAR) 100-25 MG per tablet TAKE 1 TABLET BY MOUTH DAILY.   No facility-administered encounter medications on file as of 07/28/2015.    Review of Systems  Constitutional: Negative for appetite change and unexpected weight change.  HENT: Negative for congestion and sinus pressure.   Respiratory: Negative for cough, chest  tightness and shortness of breath.   Cardiovascular: Negative for chest pain, palpitations and leg swelling.  Gastrointestinal: Negative for nausea, vomiting, abdominal pain and diarrhea.  Genitourinary: Negative for dysuria and difficulty urinating.  Skin: Negative for color change and rash.  Neurological: Negative for dizziness, light-headedness and headaches.  Psychiatric/Behavioral: Negative for dysphoric mood and agitation.       Objective:     Blood pressure rechecked by me:  128/76  Physical Exam  Constitutional: He appears well-developed and well-nourished. No distress.  HENT:  Nose: Nose normal.  Mouth/Throat: Oropharynx is clear and moist.  Neck: Neck supple. No thyromegaly present.  Cardiovascular: Normal rate and regular rhythm.   Pulmonary/Chest: Effort normal and breath sounds normal. No respiratory distress.  Abdominal: Soft. Bowel sounds are normal. There is no tenderness.  Musculoskeletal: He exhibits no edema or tenderness.  Lymphadenopathy:    He has no cervical adenopathy.  Skin: No rash noted. No erythema.  Psychiatric: He has a normal mood and affect. His behavior is normal.    BP 122/78 mmHg  Pulse 80  Temp(Src) 98 F (36.7 C) (Oral)  Resp 18  Ht 6' (1.829 m)  Wt 244 lb 4 oz (110.791 kg)  BMI 33.12 kg/m2  SpO2 95% Wt Readings from  Last 3 Encounters:  07/28/15 244 lb 4 oz (110.791 kg)  01/07/15 256 lb (116.121 kg)  10/27/14 255 lb (115.667 kg)     Lab Results  Component Value Date   WBC 7.5 07/22/2015   HGB 15.8 07/22/2015   HCT 46.5 07/22/2015   PLT 324.0 07/22/2015   GLUCOSE 110* 07/22/2015   CHOL 163 07/22/2015   TRIG 87.0 07/22/2015   HDL 44.80 07/22/2015   LDLDIRECT 160.9 08/21/2013   LDLCALC 101* 07/22/2015   ALT 109* 07/22/2015   AST 52* 07/22/2015   NA 138 07/22/2015   K 4.8 07/22/2015   CL 100 07/22/2015   CREATININE 1.06 07/22/2015   BUN 14 07/22/2015   CO2 31 07/22/2015   TSH 1.71 01/05/2015   PSA 0.51 01/05/2015    HGBA1C 6.3 07/22/2015   MICROALBUR <0.7 01/05/2015       Assessment & Plan:   Problem List Items Addressed This Visit    Abnormal liver function tests    Hepatitis panel negative.  Ultrasound revealed changes that appear to be c/w fatty liver.  Has adjusted diet.  Lost weight.  Liver panel improved.  Continue to follow.  Recheck liver panel next labs.  Hold on any further w/up.        Relevant Orders   Hepatic function panel   Diabetes mellitus    Has adjusted his diet.  Lost weight.  A1c 6.3.  Continue diet adjustment and exercise.  Follow met b and a1c.        Relevant Orders   Hemoglobin A1c   Microalbumin / creatinine urine ratio   Essential hypertension, benign - Primary    Blood pressure doing well.  Has adjusted his diet and lost weight.  Continue same medication regimen.  Follow pressures.  Follow metabolic panel.       Relevant Orders   Basic metabolic panel   Hyperlipidemia    Low cholesterol diet and exercise.  Cholesterol improving.  Follow lipid panel.   Lab Results  Component Value Date   CHOL 163 07/22/2015   HDL 44.80 07/22/2015   LDLCALC 101* 07/22/2015   LDLDIRECT 160.9 08/21/2013   TRIG 87.0 07/22/2015   CHOLHDL 4 07/22/2015        Relevant Orders   Lipid panel   Obesity (BMI 30-39.9)    Continue diet and exercise as he is doing.  Follow.        Stress    Doing better since out of his long term relationship.  Follow.         Other Visit Diagnoses    Prostate cancer screening        Relevant Orders    PSA        Einar Pheasant, MD

## 2015-07-28 NOTE — Progress Notes (Signed)
Pre-visit discussion using our clinic review tool. No additional management support is needed unless otherwise documented below in the visit note.  

## 2015-10-03 ENCOUNTER — Other Ambulatory Visit: Payer: Self-pay

## 2015-10-03 MED ORDER — LOSARTAN POTASSIUM-HCTZ 100-25 MG PO TABS: 1.0000 | ORAL_TABLET | Freq: Every day | ORAL | Status: DC

## 2015-10-04 ENCOUNTER — Other Ambulatory Visit: Payer: Self-pay

## 2015-10-04 MED ORDER — LOSARTAN POTASSIUM-HCTZ 100-25 MG PO TABS: 1.0000 | ORAL_TABLET | Freq: Every day | ORAL | Status: DC

## 2015-12-06 ENCOUNTER — Other Ambulatory Visit: Payer: Self-pay | Admitting: Internal Medicine

## 2016-01-23 ENCOUNTER — Encounter: Payer: Self-pay | Admitting: Internal Medicine

## 2016-01-23 ENCOUNTER — Other Ambulatory Visit (INDEPENDENT_AMBULATORY_CARE_PROVIDER_SITE_OTHER): Payer: BLUE CROSS/BLUE SHIELD

## 2016-01-23 DIAGNOSIS — I10 Essential (primary) hypertension: Secondary | ICD-10-CM

## 2016-01-23 DIAGNOSIS — E785 Hyperlipidemia, unspecified: Secondary | ICD-10-CM

## 2016-01-23 DIAGNOSIS — R7989 Other specified abnormal findings of blood chemistry: Secondary | ICD-10-CM | POA: Diagnosis not present

## 2016-01-23 DIAGNOSIS — Z125 Encounter for screening for malignant neoplasm of prostate: Secondary | ICD-10-CM | POA: Diagnosis not present

## 2016-01-23 DIAGNOSIS — E119 Type 2 diabetes mellitus without complications: Secondary | ICD-10-CM

## 2016-01-23 DIAGNOSIS — R945 Abnormal results of liver function studies: Secondary | ICD-10-CM

## 2016-01-23 LAB — HEPATIC FUNCTION PANEL
ALK PHOS: 50 U/L (ref 39–117)
ALT: 77 U/L — ABNORMAL HIGH (ref 0–53)
AST: 37 U/L (ref 0–37)
Albumin: 4.2 g/dL (ref 3.5–5.2)
BILIRUBIN DIRECT: 0.1 mg/dL (ref 0.0–0.3)
BILIRUBIN TOTAL: 0.5 mg/dL (ref 0.2–1.2)
Total Protein: 6.8 g/dL (ref 6.0–8.3)

## 2016-01-23 LAB — LIPID PANEL
CHOL/HDL RATIO: 4
Cholesterol: 172 mg/dL (ref 0–200)
HDL: 42.1 mg/dL (ref >39.00)
LDL Cholesterol: 111 mg/dL — ABNORMAL HIGH (ref 0–99)
NONHDL: 129.52
Triglycerides: 95 mg/dL (ref 0.0–149.0)
VLDL: 19 mg/dL (ref 0.0–40.0)

## 2016-01-23 LAB — BASIC METABOLIC PANEL
BUN: 11 mg/dL (ref 6–23)
CHLORIDE: 99 meq/L (ref 96–112)
CO2: 30 mEq/L (ref 19–32)
Calcium: 9.4 mg/dL (ref 8.4–10.5)
Creatinine, Ser: 1.05 mg/dL (ref 0.40–1.50)
GFR: 81.17 mL/min (ref >60.00)
GLUCOSE: 124 mg/dL — AB (ref 70–99)
POTASSIUM: 3.6 meq/L (ref 3.5–5.1)
Sodium: 138 mEq/L (ref 135–145)

## 2016-01-23 LAB — PSA: PSA: 0.78 ng/mL (ref 0.10–4.00)

## 2016-01-23 LAB — HEMOGLOBIN A1C: Hgb A1c MFr Bld: 6.7 % — ABNORMAL HIGH (ref 4.6–6.5)

## 2016-01-23 LAB — MICROALBUMIN / CREATININE URINE RATIO
CREATININE, U: 251.4 mg/dL
MICROALB/CREAT RATIO: 0.3 mg/g (ref 0.0–30.0)
Microalb, Ur: 0.7 mg/dL (ref 0.0–1.9)

## 2016-01-26 ENCOUNTER — Ambulatory Visit (INDEPENDENT_AMBULATORY_CARE_PROVIDER_SITE_OTHER): Payer: BLUE CROSS/BLUE SHIELD | Admitting: Internal Medicine

## 2016-01-26 ENCOUNTER — Encounter: Payer: Self-pay | Admitting: Internal Medicine

## 2016-01-26 VITALS — BP 130/70 | HR 81 | Temp 98.1°F | Resp 18 | Ht 72.0 in | Wt 255.4 lb

## 2016-01-26 DIAGNOSIS — E119 Type 2 diabetes mellitus without complications: Secondary | ICD-10-CM

## 2016-01-26 DIAGNOSIS — Z0000 Encounter for general adult medical examination without abnormal findings: Secondary | ICD-10-CM

## 2016-01-26 DIAGNOSIS — I10 Essential (primary) hypertension: Secondary | ICD-10-CM | POA: Diagnosis not present

## 2016-01-26 DIAGNOSIS — E669 Obesity, unspecified: Secondary | ICD-10-CM

## 2016-01-26 DIAGNOSIS — F439 Reaction to severe stress, unspecified: Secondary | ICD-10-CM

## 2016-01-26 DIAGNOSIS — R945 Abnormal results of liver function studies: Secondary | ICD-10-CM

## 2016-01-26 DIAGNOSIS — Z658 Other specified problems related to psychosocial circumstances: Secondary | ICD-10-CM

## 2016-01-26 DIAGNOSIS — R7989 Other specified abnormal findings of blood chemistry: Secondary | ICD-10-CM

## 2016-01-26 DIAGNOSIS — E785 Hyperlipidemia, unspecified: Secondary | ICD-10-CM

## 2016-01-26 MED ORDER — AMLODIPINE BESYLATE 10 MG PO TABS: ORAL_TABLET | ORAL | Status: DC

## 2016-01-26 MED ORDER — LOSARTAN POTASSIUM-HCTZ 100-25 MG PO TABS: 1.0000 | ORAL_TABLET | Freq: Every day | ORAL | Status: DC

## 2016-01-26 NOTE — Progress Notes (Signed)
Patient ID: Michael Lane, male   DOB: 09-26-1971, 45 y.o.   MRN: [id number]   Subjective:    Patient ID: Michael Lane, male    DOB: [birth year], 45 y.o.   MRN: [id number]  HPI  Patient here for his physical exam. He has been under increased stress recently.  Split up with his girlfriend.  He is doing well with this now.  Is exercising and going to the gym now.  No chest pain or tightness.  No sob.  No acid reflux.  No abdominal pain or cramping.  Bowels stable.  States feels better than he has in a long time.  Feels he is in the best shape now than he has been in previously.     Past Medical History  Diagnosis Date  . Hypertension    Past Surgical History  Procedure Laterality Date  . Varicocele excision     Family History  Problem Relation Age of Onset  . Arthritis Mother   . Cancer Mother     breast  . Breast cancer Mother   . Kidney disease Father     H/O kidney stones  . Hypertension Father    Social History   Social History  . Marital Status: Married    Spouse Name: N/A  . Number of Children: N/A  . Years of Education: N/A   Social History Main Topics  . Smoking status: Former Smoker -- 1.00 packs/day for 25 years    Types: Cigarettes  . Smokeless tobacco: Never Used     Comment: Quit smoking in February 2015  . Alcohol Use: No  . Drug Use: No     Comment: past  . Sexual Activity: Not Asked   Other Topics Concern  . None   Social History Narrative    Outpatient Encounter Prescriptions as of 01/26/2016  Medication Sig  . amLODipine (NORVASC) 10 MG tablet TAKE 1 TABLET (10 MG TOTAL) BY MOUTH DAILY.  Marland Kitchen losartan-hydrochlorothiazide (HYZAAR) 100-25 MG tablet Take 1 tablet by mouth daily.  . [DISCONTINUED] amLODipine (NORVASC) 10 MG tablet TAKE 1 TABLET (10 MG TOTAL) BY MOUTH DAILY.  . [DISCONTINUED] amLODipine (NORVASC) 10 MG tablet TAKE 1 TABLET (10 MG TOTAL) BY MOUTH DAILY.  . [DISCONTINUED] losartan-hydrochlorothiazide (HYZAAR) 100-25 MG tablet Take 1  tablet by mouth daily.   No facility-administered encounter medications on file as of 01/26/2016.    Review of Systems  Constitutional: Negative for appetite change and unexpected weight change.  HENT: Negative for congestion and sinus pressure.   Eyes: Negative for pain and visual disturbance.  Respiratory: Negative for cough, chest tightness and shortness of breath.   Cardiovascular: Negative for chest pain, palpitations and leg swelling.  Gastrointestinal: Negative for nausea, vomiting, abdominal pain and diarrhea.  Genitourinary: Negative for dysuria and difficulty urinating.  Musculoskeletal: Negative for back pain and joint swelling.  Skin: Negative for color change and rash.  Neurological: Negative for dizziness, light-headedness and headaches.  Hematological: Negative for adenopathy. Does not bruise/bleed easily.  Psychiatric/Behavioral: Negative for dysphoric mood and agitation.       Objective:    Physical Exam  Constitutional: He is oriented to person, place, and time. He appears well-developed and well-nourished. No distress.  HENT:  Head: Normocephalic and atraumatic.  Nose: Nose normal.  Mouth/Throat: Oropharynx is clear and moist. No oropharyngeal exudate.  Eyes: Conjunctivae are normal. Right eye exhibits no discharge. Left eye exhibits no discharge.  Neck: Neck supple. No thyromegaly present.  Cardiovascular: Normal rate and  regular rhythm.   Pulmonary/Chest: Breath sounds normal. No respiratory distress. He has no wheezes.  Abdominal: Soft. Bowel sounds are normal. There is no tenderness.  Genitourinary:  Normal descending testicles.  No palpable nodules.  Declined prostate check.    Musculoskeletal: He exhibits no edema or tenderness.  Lymphadenopathy:    He has no cervical adenopathy.  Neurological: He is alert and oriented to person, place, and time.  Skin: Skin is warm and dry. No rash noted. No erythema.  Psychiatric: He has a normal mood and affect. His  behavior is normal.    BP 130/70 mmHg  Pulse 81  Temp(Src) 98.1 F (36.7 C) (Oral)  Resp 18  Ht 6' (1.829 m)  Wt 255 lb 6 oz (115.837 kg)  BMI 34.63 kg/m2  SpO2 95% Wt Readings from Last 3 Encounters:  01/26/16 255 lb 6 oz (115.837 kg)  07/28/15 244 lb 4 oz (110.791 kg)  01/07/15 256 lb (116.121 kg)     Lab Results  Component Value Date   WBC 7.5 07/22/2015   HGB 15.8 07/22/2015   HCT 46.5 07/22/2015   PLT 324.0 07/22/2015   GLUCOSE 124* 01/23/2016   CHOL 172 01/23/2016   TRIG 95.0 01/23/2016   HDL 42.10 01/23/2016   LDLDIRECT 160.9 08/21/2013   LDLCALC 111* 01/23/2016   ALT 77* 01/23/2016   AST 37 01/23/2016   NA 138 01/23/2016   K 3.6 01/23/2016   CL 99 01/23/2016   CREATININE 1.05 01/23/2016   BUN 11 01/23/2016   CO2 30 01/23/2016   TSH 1.71 01/05/2015   PSA 0.78 01/23/2016   HGBA1C 6.7* 01/23/2016   MICROALBUR 0.7 01/23/2016        Assessment & Plan:   Problem List Items Addressed This Visit    Abnormal liver function tests    Hepatitis panel negative.  Ultrasound revealed changes c/w fatty liver.  Has adjusted diet.  Is exercising.  Liver panel improved.  Will follow.   Lab Results  Component Value Date   ALT 77* 01/23/2016   AST 37 01/23/2016   ALKPHOS 50 01/23/2016   BILITOT 0.5 01/23/2016        Relevant Orders   Hepatic function panel   Diabetes mellitus ([facility]CC)    Low carb diet and exercise.  Has adjusted his diet.  Is exercising.  Recent a1c 6.7.  Follow met b and a1c.        Relevant Medications   losartan-hydrochlorothiazide (HYZAAR) 100-25 MG tablet   Other Relevant Orders   Hemoglobin A1c   Essential hypertension, benign    Blood pressure doing well.  Same medication regimen.  Follow pressures.  Follow metabolic panel.        Relevant Medications   amLODipine (NORVASC) 10 MG tablet   losartan-hydrochlorothiazide (HYZAAR) 100-25 MG tablet   Other Relevant Orders   CBC with Differential/Platelet   TSH   Basic metabolic panel     Health care maintenance    Physical today 01/26/16.  PSA .78 01/2016.        Hyperlipidemia    Low cholesterol diet and exercise.  LDL 111.  Triglycerides 95.  Follow.        Relevant Medications   amLODipine (NORVASC) 10 MG tablet   losartan-hydrochlorothiazide (HYZAAR) 100-25 MG tablet   Other Relevant Orders   Lipid panel   Obesity (BMI 30-39.9)    Continue diet and exercise.  Follow.        Stress    Doing better.  Handling stress.  Follow.  Does not need any further intervention at this time.         Other Visit Diagnoses    Routine general medical examination at a health care facility    -  Primary        Einar Pheasant, MD

## 2016-01-26 NOTE — Progress Notes (Signed)
Pre-visit discussion using our clinic review tool. No additional management support is needed unless otherwise documented below in the visit note.  

## 2016-01-28 ENCOUNTER — Encounter: Payer: Self-pay | Admitting: Internal Medicine

## 2016-01-28 NOTE — Assessment & Plan Note (Signed)
Hepatitis panel negative.  Ultrasound revealed changes c/w fatty liver.  Has adjusted diet.  Is exercising.  Liver panel improved.  Will follow.   Lab Results  Component Value Date   ALT 77* 01/23/2016   AST 37 01/23/2016   ALKPHOS 50 01/23/2016   BILITOT 0.5 01/23/2016

## 2016-01-28 NOTE — Assessment & Plan Note (Signed)
Low cholesterol diet and exercise.  LDL 111.  Triglycerides 95.  Follow.

## 2016-01-28 NOTE — Assessment & Plan Note (Signed)
Low carb diet and exercise.  Has adjusted his diet.  Is exercising.  Recent a1c 6.7.  Follow met b and a1c.

## 2016-01-28 NOTE — Assessment & Plan Note (Signed)
Continue diet and exercise.  Follow.  

## 2016-01-28 NOTE — Assessment & Plan Note (Signed)
Doing better.  Handling stress.  Follow.  Does not need any further intervention at this time.

## 2016-01-28 NOTE — Assessment & Plan Note (Signed)
Blood pressure doing well.  Same medication regimen.  Follow pressures.  Follow metabolic panel.   

## 2016-01-28 NOTE — Assessment & Plan Note (Signed)
Physical today 01/26/16.  PSA .78 01/2016.

## 2016-02-01 ENCOUNTER — Other Ambulatory Visit: Payer: Self-pay | Admitting: Family Medicine

## 2016-02-01 ENCOUNTER — Ambulatory Visit (INDEPENDENT_AMBULATORY_CARE_PROVIDER_SITE_OTHER): Payer: BLUE CROSS/BLUE SHIELD | Admitting: Family Medicine

## 2016-02-01 ENCOUNTER — Encounter: Payer: Self-pay | Admitting: Family Medicine

## 2016-02-01 VITALS — BP 126/74 | HR 88 | Temp 98.1°F | Wt 256.0 lb

## 2016-02-01 DIAGNOSIS — Z7251 High risk heterosexual behavior: Secondary | ICD-10-CM | POA: Diagnosis not present

## 2016-02-01 DIAGNOSIS — R350 Frequency of micturition: Secondary | ICD-10-CM | POA: Diagnosis not present

## 2016-02-01 LAB — POC URINALSYSI DIPSTICK (AUTOMATED)
BILIRUBIN UA: NEGATIVE
Glucose, UA: 2000
KETONES UA: NEGATIVE
LEUKOCYTES UA: NEGATIVE
NITRITE UA: NEGATIVE
Protein, UA: NEGATIVE
RBC UA: NEGATIVE
Spec Grav, UA: 1.02
Urobilinogen, UA: NEGATIVE
pH, UA: 7

## 2016-02-01 NOTE — Patient Instructions (Signed)
We will call with the results as soon as we have them.  If you worsen, please let us know.  Take care  Dr. Lacinda Axon

## 2016-02-01 NOTE — Addendum Note (Signed)
Addended by: Lindalou Hose Y on: 02/01/2016 02:00 PM   Modules accepted: Orders

## 2016-02-01 NOTE — Progress Notes (Signed)
Pre visit review using our clinic review tool, if applicable. No additional management support is needed unless otherwise documented below in the visit note. 

## 2016-02-01 NOTE — Progress Notes (Signed)
Subjective:  Patient ID: Michael Lane, male    DOB: 02-12-1971  Age: 45 y.o. MRN: [id number]  CC: ? UTI  HPI:  45 year old male presents to the clinic today for an acute visit with complaints that he may have a UTI.  Patient states that it "feels different" around the tip of his penis. This is been going on for the past week. He denies any dysuria, urgency, frequency. He does state that he has increased his fluid intake. No associated fevers or chills. No flank pain. No known exacerbating or relieving factors. Also, he does state that he had unprotected sexual intercourse approximately 2 weeks ago. No penile discharge. No reports of lymphadenopathy. No other complaints.  Social Hx   Social History   Social History  . Marital Status: Married    Spouse Name: N/A  . Number of Children: N/A  . Years of Education: N/A   Social History Main Topics  . Smoking status: Former Smoker -- 1.00 packs/day for 25 years    Types: Cigarettes  . Smokeless tobacco: Never Used     Comment: Quit smoking in February 2015  . Alcohol Use: No  . Drug Use: No     Comment: past  . Sexual Activity: Not Asked   Other Topics Concern  . None   Social History Narrative   Review of Systems  Constitutional: Negative for fever.  Genitourinary: Negative for dysuria, urgency and frequency.   Objective:  BP 126/74 mmHg  Pulse 88  Temp(Src) 98.1 F (36.7 C) (Oral)  Wt 256 lb (116.121 kg)  SpO2 98%  BP/Weight 02/01/2016 01/26/2016 0000000  Systolic BP [id number]  Diastolic BP 74 70 78  Wt. (Lbs) 256 255.38 244.25  BMI 34.71 34.63 33.12   Physical Exam  Constitutional: He is oriented to person, place, and time. He appears well-developed. No distress.  HENT:  Head: Normocephalic and atraumatic.  Cardiovascular: Regular rhythm.   Murmur heard. Pulmonary/Chest: Effort normal and breath sounds normal.  Abdominal: Soft. He exhibits no distension. There is no tenderness.  Neurological: He is alert  and oriented to person, place, and time.  Vitals reviewed.  Lab Results  Component Value Date   WBC 7.5 07/22/2015   HGB 15.8 07/22/2015   HCT 46.5 07/22/2015   PLT 324.0 07/22/2015   GLUCOSE 124* 01/23/2016   CHOL 172 01/23/2016   TRIG 95.0 01/23/2016   HDL 42.10 01/23/2016   LDLDIRECT 160.9 08/21/2013   LDLCALC 111* 01/23/2016   ALT 77* 01/23/2016   AST 37 01/23/2016   NA 138 01/23/2016   K 3.6 01/23/2016   CL 99 01/23/2016   CREATININE 1.05 01/23/2016   BUN 11 01/23/2016   CO2 30 01/23/2016   TSH 1.71 01/05/2015   PSA 0.78 01/23/2016   HGBA1C 6.7* 01/23/2016   MICROALBUR 0.7 01/23/2016   Assessment & Plan:   Problem List Items Addressed This Visit    Unprotected sexual intercourse - Primary    New problem. Patient presents with complaints of an odd sensation around the tip of his penis particularly with urination. Urinalysis was positive for glucose but was otherwise negative. Sending for culture. Given objective sexual intercourse, will obtain STD testing. Patient will return for a dirty urine specimen and will send for GC/chlamydia. Offered HIV and syphilis screening and patient declined.      Relevant Orders   Urine culture   GC/chlamydia probe amp, urine     Follow-up: PRN  Thersa Salt DO [facility]  [place]

## 2016-02-01 NOTE — Assessment & Plan Note (Signed)
New problem. Patient presents with complaints of an odd sensation around the tip of his penis particularly with urination. Urinalysis was positive for glucose but was otherwise negative. Sending for culture. Given objective sexual intercourse, will obtain STD testing. Patient will return for a dirty urine specimen and will send for GC/chlamydia. Offered HIV and syphilis screening and patient declined.

## 2016-02-03 LAB — URINE CULTURE
Colony Count: NO GROWTH
ORGANISM ID, BACTERIA: NO GROWTH

## 2016-02-03 LAB — GC/CHLAMYDIA PROBE AMP
CT PROBE, AMP APTIMA: DETECTED — AB
GC PROBE AMP APTIMA: NOT DETECTED

## 2016-02-07 ENCOUNTER — Other Ambulatory Visit: Payer: Self-pay | Admitting: Family Medicine

## 2016-02-07 MED ORDER — AZITHROMYCIN 250 MG PO TABS: ORAL_TABLET | ORAL | Status: DC

## 2016-02-27 ENCOUNTER — Other Ambulatory Visit: Payer: Self-pay

## 2016-02-27 MED ORDER — LOSARTAN POTASSIUM-HCTZ 100-25 MG PO TABS: 1.0000 | ORAL_TABLET | Freq: Every day | ORAL | Status: DC

## 2016-06-02 ENCOUNTER — Encounter: Payer: Self-pay | Admitting: Internal Medicine

## 2016-06-02 DIAGNOSIS — R5383 Other fatigue: Secondary | ICD-10-CM

## 2016-06-07 NOTE — Telephone Encounter (Signed)
Order placed for testosterone to be checked with next labs.

## 2016-07-27 ENCOUNTER — Other Ambulatory Visit (INDEPENDENT_AMBULATORY_CARE_PROVIDER_SITE_OTHER): Payer: BLUE CROSS/BLUE SHIELD

## 2016-07-27 ENCOUNTER — Encounter: Payer: Self-pay | Admitting: Internal Medicine

## 2016-07-27 DIAGNOSIS — R945 Abnormal results of liver function studies: Secondary | ICD-10-CM

## 2016-07-27 DIAGNOSIS — E119 Type 2 diabetes mellitus without complications: Secondary | ICD-10-CM | POA: Diagnosis not present

## 2016-07-27 DIAGNOSIS — E785 Hyperlipidemia, unspecified: Secondary | ICD-10-CM

## 2016-07-27 DIAGNOSIS — R7989 Other specified abnormal findings of blood chemistry: Secondary | ICD-10-CM

## 2016-07-27 DIAGNOSIS — I10 Essential (primary) hypertension: Secondary | ICD-10-CM

## 2016-07-27 DIAGNOSIS — R5383 Other fatigue: Secondary | ICD-10-CM

## 2016-07-27 LAB — BASIC METABOLIC PANEL
BUN: 14 mg/dL (ref 6–23)
CHLORIDE: 102 meq/L (ref 96–112)
CO2: 31 meq/L (ref 19–32)
Calcium: 9.1 mg/dL (ref 8.4–10.5)
Creatinine, Ser: 1 mg/dL (ref 0.40–1.50)
GFR: 85.67 mL/min (ref >60.00)
Glucose, Bld: 130 mg/dL — ABNORMAL HIGH (ref 70–99)
Potassium: 3.8 mEq/L (ref 3.5–5.1)
SODIUM: 140 meq/L (ref 135–145)

## 2016-07-27 LAB — LIPID PANEL
Cholesterol: 175 mg/dL (ref 0–200)
HDL: 41 mg/dL (ref >39.00)
LDL CALC: 114 mg/dL — AB (ref 0–99)
NONHDL: 133.6
Total CHOL/HDL Ratio: 4
Triglycerides: 97 mg/dL (ref 0.0–149.0)
VLDL: 19.4 mg/dL (ref 0.0–40.0)

## 2016-07-27 LAB — CBC WITH DIFFERENTIAL/PLATELET
BASOS PCT: 0.6 % (ref 0.0–3.0)
Basophils Absolute: 0 10*3/uL (ref 0.0–0.1)
EOS PCT: 2 % (ref 0.0–5.0)
Eosinophils Absolute: 0.1 10*3/uL (ref 0.0–0.7)
HEMATOCRIT: 47.2 % (ref 39.0–52.0)
HEMOGLOBIN: 16.3 g/dL (ref 13.0–17.0)
Lymphocytes Relative: 46.3 % — ABNORMAL HIGH (ref 12.0–46.0)
Lymphs Abs: 3.2 10*3/uL (ref 0.7–4.0)
MCHC: 34.5 g/dL (ref 30.0–36.0)
MCV: 91.3 fl (ref 78.0–100.0)
MONO ABS: 0.5 10*3/uL (ref 0.1–1.0)
Monocytes Relative: 7 % (ref 3.0–12.0)
Neutro Abs: 3.1 10*3/uL (ref 1.4–7.7)
Neutrophils Relative %: 44.1 % (ref 43.0–77.0)
Platelets: 298 10*3/uL (ref 150.0–400.0)
RBC: 5.17 Mil/uL (ref 4.22–5.81)
RDW: 13.4 % (ref 11.5–15.5)
WBC: 7 10*3/uL (ref 4.0–10.5)

## 2016-07-27 LAB — HEPATIC FUNCTION PANEL
ALT: 112 U/L — ABNORMAL HIGH (ref 0–53)
AST: 59 U/L — ABNORMAL HIGH (ref 0–37)
Albumin: 3.9 g/dL (ref 3.5–5.2)
Alkaline Phosphatase: 48 U/L (ref 39–117)
BILIRUBIN TOTAL: 0.6 mg/dL (ref 0.2–1.2)
Bilirubin, Direct: 0.1 mg/dL (ref 0.0–0.3)
Total Protein: 6.9 g/dL (ref 6.0–8.3)

## 2016-07-27 LAB — TSH: TSH: 1.76 u[IU]/mL (ref 0.35–4.50)

## 2016-07-27 LAB — HEMOGLOBIN A1C: HEMOGLOBIN A1C: 6.6 % — AB (ref 4.6–6.5)

## 2016-07-29 ENCOUNTER — Encounter: Payer: Self-pay | Admitting: Internal Medicine

## 2016-07-29 LAB — TESTOSTERONE,FREE AND TOTAL
TESTOSTERONE FREE: 13 pg/mL (ref 6.8–21.5)
Testosterone: 478 ng/dL (ref 264–916)

## 2016-07-31 ENCOUNTER — Encounter: Payer: Self-pay | Admitting: Internal Medicine

## 2016-07-31 ENCOUNTER — Ambulatory Visit (INDEPENDENT_AMBULATORY_CARE_PROVIDER_SITE_OTHER): Payer: BLUE CROSS/BLUE SHIELD | Admitting: Internal Medicine

## 2016-07-31 DIAGNOSIS — I10 Essential (primary) hypertension: Secondary | ICD-10-CM

## 2016-07-31 DIAGNOSIS — E119 Type 2 diabetes mellitus without complications: Secondary | ICD-10-CM

## 2016-07-31 DIAGNOSIS — E7800 Pure hypercholesterolemia, unspecified: Secondary | ICD-10-CM | POA: Diagnosis not present

## 2016-07-31 DIAGNOSIS — E669 Obesity, unspecified: Secondary | ICD-10-CM

## 2016-07-31 DIAGNOSIS — F439 Reaction to severe stress, unspecified: Secondary | ICD-10-CM | POA: Diagnosis not present

## 2016-07-31 DIAGNOSIS — R945 Abnormal results of liver function studies: Secondary | ICD-10-CM

## 2016-07-31 DIAGNOSIS — R7989 Other specified abnormal findings of blood chemistry: Secondary | ICD-10-CM

## 2016-07-31 NOTE — Progress Notes (Signed)
Pre visit review using our clinic review tool, if applicable. No additional management support is needed unless otherwise documented below in the visit note. 

## 2016-07-31 NOTE — Progress Notes (Signed)
Patient ID: Laurence Ferrari, male   DOB: [birth year], 45 y.o.   MRN: [id number]   Subjective:    Patient ID: Laurence Ferrari, male    DOB: Dec 02, 1970, 45 y.o.   MRN: [id number]  HPI  Patient here for a scheduled follow up.  He reports he has still been exercising.  Watching his diet. Lifts weights.  States feels the best physically then he has in a long time.  No chest pain.  No sob.  No acid reflux.  No abdominal pain or cramping.  Bowels stable.  Increased stress.  Some question of mild depression.  Out of his long term relationship.  States feels lonely at times.  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  States he feels good.  No suicidal ideations.  Discussed with him today.  He desires no medication.  Discussed counseling.  He knows a Social worker that he has seen previously.  May call her back.  Overall he feels he is doing well.     Past Medical History:  Diagnosis Date  . Hypertension    Past Surgical History:  Procedure Laterality Date  . VARICOCELE EXCISION     Family History  Problem Relation Age of Onset  . Arthritis Mother   . Cancer Mother     breast  . Breast cancer Mother   . Kidney disease Father     H/O kidney stones  . Hypertension Father    Social History   Social History  . Marital status: Married    Spouse name: N/A  . Number of children: N/A  . Years of education: N/A   Social History Main Topics  . Smoking status: Former Smoker    Packs/day: 1.00    Years: 25.00    Types: Cigarettes  . Smokeless tobacco: Never Used     Comment: Quit smoking in February 2015  . Alcohol use No  . Drug use: No     Comment: past  . Sexual activity: Not Asked   Other Topics Concern  . None   Social History Narrative  . None    Outpatient Encounter Prescriptions as of 07/31/2016  Medication Sig  . amLODipine (NORVASC) 10 MG tablet TAKE 1 TABLET (10 MG TOTAL) BY MOUTH DAILY.  Marland Kitchen losartan-hydrochlorothiazide (HYZAAR) 100-25 MG tablet Take 1 tablet by mouth daily.  . [DISCONTINUED]  azithromycin (ZITHROMAX) 250 MG tablet 4 tablets (1000 mg) once.   No facility-administered encounter medications on file as of 07/31/2016.     Review of Systems  Constitutional: Negative for appetite change and unexpected weight change.  HENT: Negative for congestion and sinus pressure.   Respiratory: Negative for cough, chest tightness and shortness of breath.   Cardiovascular: Negative for chest pain, palpitations and leg swelling.  Gastrointestinal: Negative for abdominal pain, diarrhea, nausea and vomiting.  Genitourinary: Negative for difficulty urinating and dysuria.  Musculoskeletal: Negative for back pain and joint swelling.  Skin: Negative for color change and rash.  Neurological: Negative for dizziness, light-headedness and headaches.  Psychiatric/Behavioral: Negative for agitation and dysphoric mood.       Objective:     Blood pressure rechecked by me:  128-130/80-82  Physical Exam  Constitutional: He appears well-developed and well-nourished. No distress.  HENT:  Nose: Nose normal.  Mouth/Throat: Oropharynx is clear and moist.  Neck: Neck supple. No thyromegaly present.  Cardiovascular: Normal rate and regular rhythm.   Pulmonary/Chest: Effort normal and breath sounds normal. No respiratory distress.  Abdominal: Soft. Bowel sounds are normal. There is no  tenderness.  Musculoskeletal: He exhibits no edema or tenderness.  Lymphadenopathy:    He has no cervical adenopathy.  Skin: No rash noted. No erythema.  Psychiatric: He has a normal mood and affect. His behavior is normal.    BP 130/82   Pulse 82   Temp 98 F (36.7 C) (Oral)   Wt 262 lb 12.8 oz (119.2 kg)   SpO2 95%   BMI 35.64 kg/m  Wt Readings from Last 3 Encounters:  07/31/16 262 lb 12.8 oz (119.2 kg)  02/01/16 256 lb (116.1 kg)  01/26/16 255 lb 6 oz (115.8 kg)     Lab Results  Component Value Date   WBC 7.0 07/27/2016   HGB 16.3 07/27/2016   HCT 47.2 07/27/2016   PLT 298.0 07/27/2016    GLUCOSE 130 (H) 07/27/2016   CHOL 175 07/27/2016   TRIG 97.0 07/27/2016   HDL 41.00 07/27/2016   LDLDIRECT 160.9 08/21/2013   LDLCALC 114 (H) 07/27/2016   ALT 112 (H) 07/27/2016   AST 59 (H) 07/27/2016   NA 140 07/27/2016   K 3.8 07/27/2016   CL 102 07/27/2016   CREATININE 1.00 07/27/2016   BUN 14 07/27/2016   CO2 31 07/27/2016   TSH 1.76 07/27/2016   PSA 0.78 01/23/2016   HGBA1C 6.6 (H) 07/27/2016   MICROALBUR 0.7 01/23/2016       Assessment & Plan:   Problem List Items Addressed This Visit    Abnormal liver function tests    Persistent abnormal liver function tests.  Has adjusted his diet.  Sugars and cholesterol better.  Ultrasound revealed fatty liver.  Refer to GI for evaluation and further testing and treatment.        Relevant Orders   Ambulatory referral to Gastroenterology   Diabetes mellitus ([place])    Low carb diet and exercise.  Sugars have been doing better.  Follow met b and a1c.  Lab Results  Component Value Date   HGBA1C 6.6 (H) 07/27/2016        Relevant Orders   Hemoglobin F5P   Basic metabolic panel   Essential hypertension, benign    Blood pressure on recheck improved.  Continue same medication regimen.  Follow pressures.  Follow metabolic panel.        Hyperlipidemia    Low cholesterol diet and exercise.  Follow lipid panel.   Lab Results  Component Value Date   CHOL 175 07/27/2016   HDL 41.00 07/27/2016   LDLCALC 114 (H) 07/27/2016   LDLDIRECT 160.9 08/21/2013   TRIG 97.0 07/27/2016   CHOLHDL 4 07/27/2016        Relevant Orders   Lipid panel   Hepatic function panel   Obesity (BMI 30-39.9)    Continue diet and exercise.  Follow.       Stress    Discussed with him today.  He knows a Social worker.  May call.  Will follow.  Desires no further intervention at this time.  Declines medication.  Follow.        Other Visit Diagnoses   None.      Einar Pheasant, MD

## 2016-08-06 ENCOUNTER — Encounter: Payer: Self-pay | Admitting: Internal Medicine

## 2016-08-06 NOTE — Assessment & Plan Note (Signed)
Continue diet and exercise.  Follow.  

## 2016-08-06 NOTE — Assessment & Plan Note (Signed)
Blood pressure on recheck improved.  Continue same medication regimen.  Follow pressures.  Follow metabolic panel.   

## 2016-08-06 NOTE — Assessment & Plan Note (Signed)
Low carb diet and exercise.  Sugars have been doing better.  Follow met b and a1c.  Lab Results  Component Value Date   HGBA1C 6.6 (H) 07/27/2016

## 2016-08-06 NOTE — Assessment & Plan Note (Signed)
Discussed with him today.  He knows a Social worker.  May call.  Will follow.  Desires no further intervention at this time.  Declines medication.  Follow.

## 2016-08-06 NOTE — Assessment & Plan Note (Signed)
Persistent abnormal liver function tests.  Has adjusted his diet.  Sugars and cholesterol better.  Ultrasound revealed fatty liver.  Refer to GI for evaluation and further testing and treatment.

## 2016-08-06 NOTE — Assessment & Plan Note (Signed)
Low cholesterol diet and exercise.  Follow lipid panel.   Lab Results  Component Value Date   CHOL 175 07/27/2016   HDL 41.00 07/27/2016   LDLCALC 114 (H) 07/27/2016   LDLDIRECT 160.9 08/21/2013   TRIG 97.0 07/27/2016   CHOLHDL 4 07/27/2016

## 2016-08-08 ENCOUNTER — Encounter: Payer: Self-pay | Admitting: Internal Medicine

## 2016-11-27 ENCOUNTER — Other Ambulatory Visit: Payer: Self-pay | Admitting: Internal Medicine

## 2016-12-12 ENCOUNTER — Telehealth: Payer: Self-pay | Admitting: *Deleted

## 2016-12-12 MED ORDER — OSELTAMIVIR PHOSPHATE 75 MG PO CAPS: ORAL_CAPSULE | ORAL | 0 refills | Status: DC

## 2016-12-12 NOTE — Telephone Encounter (Signed)
Need to know how he was exposed.  Is he taking care of or living with person infected.  If no and just casual contact, then keep an eye on symptoms and let us know if any problems.

## 2016-12-12 NOTE — Telephone Encounter (Signed)
Pt reported being exposed to the flu this weekend,pt has no symptoms, however he questioned if he should have tamiflu called into the pharmacy for preventative care . Pt contact [phone]

## 2016-12-12 NOTE — Telephone Encounter (Signed)
He was at beach all weekend with a friend who was diagnosed when they got home. Patient is not having any symptoms.

## 2016-12-12 NOTE — Telephone Encounter (Signed)
Spoke to pt.  Clarified he was staying with the friends who were diagnosed with flu.  They were actively coughing,etc when he was there.  Prophylactive tamiflu rx sent in.  Pt informed to change to treatment dose if develops symptoms.  Call if problems.

## 2017-01-28 ENCOUNTER — Other Ambulatory Visit: Payer: BLUE CROSS/BLUE SHIELD

## 2017-01-31 ENCOUNTER — Encounter: Payer: BLUE CROSS/BLUE SHIELD | Admitting: Internal Medicine

## 2017-02-22 ENCOUNTER — Other Ambulatory Visit: Payer: Self-pay | Admitting: Internal Medicine

## 2017-05-07 ENCOUNTER — Ambulatory Visit (INDEPENDENT_AMBULATORY_CARE_PROVIDER_SITE_OTHER): Payer: BLUE CROSS/BLUE SHIELD

## 2017-05-07 ENCOUNTER — Encounter: Payer: Self-pay | Admitting: Internal Medicine

## 2017-05-07 ENCOUNTER — Ambulatory Visit (INDEPENDENT_AMBULATORY_CARE_PROVIDER_SITE_OTHER): Payer: BLUE CROSS/BLUE SHIELD | Admitting: Internal Medicine

## 2017-05-07 VITALS — BP 132/78 | HR 71 | Temp 98.7°F | Resp 18 | Ht 72.0 in | Wt 268.4 lb

## 2017-05-07 DIAGNOSIS — I10 Essential (primary) hypertension: Secondary | ICD-10-CM | POA: Diagnosis not present

## 2017-05-07 DIAGNOSIS — F439 Reaction to severe stress, unspecified: Secondary | ICD-10-CM

## 2017-05-07 DIAGNOSIS — M25512 Pain in left shoulder: Secondary | ICD-10-CM | POA: Diagnosis not present

## 2017-05-07 DIAGNOSIS — E7800 Pure hypercholesterolemia, unspecified: Secondary | ICD-10-CM

## 2017-05-07 DIAGNOSIS — Z125 Encounter for screening for malignant neoplasm of prostate: Secondary | ICD-10-CM | POA: Diagnosis not present

## 2017-05-07 DIAGNOSIS — Z0000 Encounter for general adult medical examination without abnormal findings: Secondary | ICD-10-CM

## 2017-05-07 DIAGNOSIS — Z6836 Body mass index (BMI) 36.0-36.9, adult: Secondary | ICD-10-CM | POA: Diagnosis not present

## 2017-05-07 DIAGNOSIS — R945 Abnormal results of liver function studies: Secondary | ICD-10-CM

## 2017-05-07 DIAGNOSIS — R7989 Other specified abnormal findings of blood chemistry: Secondary | ICD-10-CM

## 2017-05-07 DIAGNOSIS — E119 Type 2 diabetes mellitus without complications: Secondary | ICD-10-CM

## 2017-05-07 MED ORDER — AMLODIPINE BESYLATE 10 MG PO TABS: ORAL_TABLET | ORAL | 1 refills | Status: DC

## 2017-05-07 MED ORDER — LOSARTAN POTASSIUM-HCTZ 100-25 MG PO TABS: 1.0000 | ORAL_TABLET | Freq: Every day | ORAL | 3 refills | Status: DC

## 2017-05-07 NOTE — Assessment & Plan Note (Signed)
Physical today 05/07/17.  Check PSA with next labs.

## 2017-05-07 NOTE — Progress Notes (Signed)
Patient ID: Michael Lane, male   DOB: [birth year], 46 y.o.   MRN: [id number]   Subjective:    Patient ID: Michael Lane, male    DOB: Jul 12, 1971, 46 y.o.   MRN: [id number]  HPI  Patient here for his physical exam.  He has been followed for abnormal liver function tests and hypercholesterolemia and diabetes.  He reports he is still exercising.  Lifting weights.  Does some aerobic activity.  No chest pain.  No sob.  No acid reflux.  No abdominal pain.  Bowels moving.  No urine change.  Having some problems with left shoulder.  Some increased discomfort at night.  Also discomfort with certain movements.  Does lift wieights but no specific injury.  Has taken some ibuprofen previously.  Discussed diet and exercise.     Past Medical History:  Diagnosis Date  . Hypertension    Past Surgical History:  Procedure Laterality Date  . VARICOCELE EXCISION     Family History  Problem Relation Age of Onset  . Arthritis Mother   . Cancer Mother        breast  . Breast cancer Mother   . Kidney disease Father        H/O kidney stones  . Hypertension Father    Social History   Social History  . Marital status: Married    Spouse name: N/A  . Number of children: N/A  . Years of education: N/A   Social History Main Topics  . Smoking status: Former Smoker    Packs/day: 1.00    Years: 25.00    Types: Cigarettes  . Smokeless tobacco: Never Used     Comment: Quit smoking in February 2015  . Alcohol use No  . Drug use: No     Comment: past  . Sexual activity: Not Asked   Other Topics Concern  . None   Social History Narrative  . None    Outpatient Encounter Prescriptions as of 05/07/2017  Medication Sig  . amLODipine (NORVASC) 10 MG tablet TAKE 1 TABLET (10 MG TOTAL) BY MOUTH DAILY.  Marland Kitchen losartan-hydrochlorothiazide (HYZAAR) 100-25 MG tablet Take 1 tablet by mouth daily.  . [DISCONTINUED] amLODipine (NORVASC) 10 MG tablet TAKE 1 TABLET (10 MG TOTAL) BY MOUTH DAILY.  . [DISCONTINUED]  losartan-hydrochlorothiazide (HYZAAR) 100-25 MG tablet Take 1 tablet by mouth daily.  . [DISCONTINUED] losartan-hydrochlorothiazide (HYZAAR) 100-25 MG tablet TAKE 1 TABLET BY MOUTH DAILY.  . [DISCONTINUED] oseltamivir (TAMIFLU) 75 MG capsule One po q day for 10 days   No facility-administered encounter medications on file as of 05/07/2017.     Review of Systems  Constitutional: Negative for appetite change and unexpected weight change.  HENT: Negative for congestion and sinus pressure.   Eyes: Negative for pain and visual disturbance.  Respiratory: Negative for cough, chest tightness and shortness of breath.   Cardiovascular: Negative for chest pain, palpitations and leg swelling.  Gastrointestinal: Negative for abdominal pain, diarrhea, nausea and vomiting.  Genitourinary: Negative for difficulty urinating and dysuria.  Musculoskeletal: Negative for back pain and joint swelling.       Left shoulder pain as outlined.    Skin: Negative for color change and rash.  Neurological: Negative for dizziness, light-headedness and headaches.  Hematological: Negative for adenopathy. Does not bruise/bleed easily.  Psychiatric/Behavioral: Negative for agitation and dysphoric mood.       Objective:     Blood pressure rechecked by me:  132/78  Physical Exam  Constitutional: He is oriented to  person, place, and time. He appears well-developed and well-nourished. No distress.  HENT:  Head: Normocephalic and atraumatic.  Nose: Nose normal.  Mouth/Throat: Oropharynx is clear and moist. No oropharyngeal exudate.  Eyes: Conjunctivae are normal. Right eye exhibits no discharge. Left eye exhibits no discharge.  Neck: Neck supple. No thyromegaly present.  Cardiovascular: Normal rate and regular rhythm.   Pulmonary/Chest: Breath sounds normal. No respiratory distress. He has no wheezes.  Abdominal: Soft. Bowel sounds are normal. There is no tenderness.  Genitourinary:  Genitourinary Comments: Normal  descended testicles.  No palpable nodules.  Declined prostate check.    Musculoskeletal: He exhibits no edema or tenderness.  Lymphadenopathy:    He has no cervical adenopathy.  Neurological: He is alert and oriented to person, place, and time.  Skin: Skin is warm and dry. No rash noted. No erythema.  Psychiatric: He has a normal mood and affect. His behavior is normal.    BP 132/78   Pulse 71   Temp 98.7 F (37.1 C) (Oral)   Resp 18   Ht 6' (1.829 m)   Wt 268 lb 6.4 oz (121.7 kg)   SpO2 96%   BMI 36.40 kg/m  Wt Readings from Last 3 Encounters:  05/07/17 268 lb 6.4 oz (121.7 kg)  07/31/16 262 lb 12.8 oz (119.2 kg)  02/01/16 256 lb (116.1 kg)     Lab Results  Component Value Date   WBC 7.0 07/27/2016   HGB 16.3 07/27/2016   HCT 47.2 07/27/2016   PLT 298.0 07/27/2016   GLUCOSE 130 (H) 07/27/2016   CHOL 175 07/27/2016   TRIG 97.0 07/27/2016   HDL 41.00 07/27/2016   LDLDIRECT 160.9 08/21/2013   LDLCALC 114 (H) 07/27/2016   ALT 112 (H) 07/27/2016   AST 59 (H) 07/27/2016   NA 140 07/27/2016   K 3.8 07/27/2016   CL 102 07/27/2016   CREATININE 1.00 07/27/2016   BUN 14 07/27/2016   CO2 31 07/27/2016   TSH 1.76 07/27/2016   PSA 0.78 01/23/2016   HGBA1C 6.6 (H) 07/27/2016   MICROALBUR 0.7 01/23/2016       Assessment & Plan:   Problem List Items Addressed This Visit    Abnormal liver function tests    Discussed diet, exercise and weight loss.  Also discussed the need for good sugar control.  Follow liver function tests.        Relevant Orders   Hepatic function panel   BMI 36.0-36.9,adult    Discussed diet and exercise.  Follow.        Diabetes mellitus ([place])    Low carb diet and exercise.  Follow met b and a1c.        Relevant Medications   losartan-hydrochlorothiazide (HYZAAR) 100-25 MG tablet   Other Relevant Orders   Hemoglobin H4T   Basic metabolic panel   Microalbumin / creatinine urine ratio   Essential hypertension, benign    Blood pressure on  recheck improved.  Same medication regimen.  Follow pressures.  Follow metabolic panel.        Relevant Medications   amLODipine (NORVASC) 10 MG tablet   losartan-hydrochlorothiazide (HYZAAR) 100-25 MG tablet   Other Relevant Orders   CBC with Differential/Platelet   RESOLVED: Essential hypertension, malignant   Relevant Medications   amLODipine (NORVASC) 10 MG tablet   losartan-hydrochlorothiazide (HYZAAR) 100-25 MG tablet   Health care maintenance    Physical today 05/07/17.  Check PSA with next labs.        Hyperlipidemia  Low cholesterol diet and exercise.  Follow lipid panel.        Relevant Medications   amLODipine (NORVASC) 10 MG tablet   losartan-hydrochlorothiazide (HYZAAR) 100-25 MG tablet   Other Relevant Orders   Lipid panel   Stress    Overall doing well.  Does not feel needs any further intervention.  Follow.         Other Visit Diagnoses    Left shoulder pain, unspecified chronicity    -  Primary   Persistent pain.  check xray.  follow.    Relevant Orders   DG Shoulder Left (Completed)   Prostate cancer screening       Relevant Orders   PSA       Einar Pheasant, MD

## 2017-05-07 NOTE — Progress Notes (Signed)
Pre-visit discussion using our clinic review tool. No additional management support is needed unless otherwise documented below in the visit note.  

## 2017-05-08 ENCOUNTER — Encounter: Payer: Self-pay | Admitting: Internal Medicine

## 2017-05-09 ENCOUNTER — Encounter: Payer: Self-pay | Admitting: Internal Medicine

## 2017-05-09 NOTE — Assessment & Plan Note (Signed)
Overall doing well.  Does not feel needs any further intervention.  Follow.  

## 2017-05-09 NOTE — Assessment & Plan Note (Signed)
Low carb diet and exercise.  Follow met b and a1c.   

## 2017-05-09 NOTE — Assessment & Plan Note (Signed)
Low cholesterol diet and exercise.  Follow lipid panel.   

## 2017-05-09 NOTE — Assessment & Plan Note (Signed)
Blood pressure on recheck improved.  Same medication regimen.  Follow pressures.  Follow metabolic panel.   

## 2017-05-09 NOTE — Assessment & Plan Note (Signed)
Discussed diet, exercise and weight loss.  Also discussed the need for good sugar control.  Follow liver function tests.

## 2017-05-09 NOTE — Assessment & Plan Note (Signed)
Discussed diet and exercise.  Follow.  

## 2017-05-10 NOTE — Telephone Encounter (Signed)
Hard copy mailed  

## 2017-05-14 ENCOUNTER — Other Ambulatory Visit (INDEPENDENT_AMBULATORY_CARE_PROVIDER_SITE_OTHER): Payer: BLUE CROSS/BLUE SHIELD

## 2017-05-14 DIAGNOSIS — Z125 Encounter for screening for malignant neoplasm of prostate: Secondary | ICD-10-CM | POA: Diagnosis not present

## 2017-05-14 DIAGNOSIS — R7989 Other specified abnormal findings of blood chemistry: Secondary | ICD-10-CM

## 2017-05-14 DIAGNOSIS — E7800 Pure hypercholesterolemia, unspecified: Secondary | ICD-10-CM

## 2017-05-14 DIAGNOSIS — E119 Type 2 diabetes mellitus without complications: Secondary | ICD-10-CM

## 2017-05-14 DIAGNOSIS — I10 Essential (primary) hypertension: Secondary | ICD-10-CM | POA: Diagnosis not present

## 2017-05-14 DIAGNOSIS — R945 Abnormal results of liver function studies: Secondary | ICD-10-CM

## 2017-05-14 LAB — CBC WITH DIFFERENTIAL/PLATELET
Basophils Absolute: 0 10*3/uL (ref 0.0–0.1)
Basophils Relative: 0.7 % (ref 0.0–3.0)
EOS PCT: 1.7 % (ref 0.0–5.0)
Eosinophils Absolute: 0.1 10*3/uL (ref 0.0–0.7)
HCT: 46.7 % (ref 39.0–52.0)
HEMOGLOBIN: 15.9 g/dL (ref 13.0–17.0)
Lymphocytes Relative: 48.4 % — ABNORMAL HIGH (ref 12.0–46.0)
Lymphs Abs: 3.1 10*3/uL (ref 0.7–4.0)
MCHC: 33.9 g/dL (ref 30.0–36.0)
MCV: 92.9 fl (ref 78.0–100.0)
Monocytes Absolute: 0.4 10*3/uL (ref 0.1–1.0)
Monocytes Relative: 6.1 % (ref 3.0–12.0)
Neutro Abs: 2.7 10*3/uL (ref 1.4–7.7)
Neutrophils Relative %: 43.1 % (ref 43.0–77.0)
Platelets: 256 10*3/uL (ref 150.0–400.0)
RBC: 5.03 Mil/uL (ref 4.22–5.81)
RDW: 13.4 % (ref 11.5–15.5)
WBC: 6.3 10*3/uL (ref 4.0–10.5)

## 2017-05-14 LAB — LIPID PANEL
Cholesterol: 188 mg/dL (ref 0–200)
HDL: 44.3 mg/dL (ref >39.00)
LDL CALC: 121 mg/dL — AB (ref 0–99)
NONHDL: 143.28
Total CHOL/HDL Ratio: 4
Triglycerides: 113 mg/dL (ref 0.0–149.0)
VLDL: 22.6 mg/dL (ref 0.0–40.0)

## 2017-05-14 LAB — MICROALBUMIN / CREATININE URINE RATIO
Creatinine,U: 230.2 mg/dL
MICROALB UR: 0.9 mg/dL (ref 0.0–1.9)
MICROALB/CREAT RATIO: 0.4 mg/g (ref 0.0–30.0)

## 2017-05-14 LAB — HEPATIC FUNCTION PANEL
ALBUMIN: 4.1 g/dL (ref 3.5–5.2)
ALT: 95 U/L — ABNORMAL HIGH (ref 0–53)
AST: 44 U/L — ABNORMAL HIGH (ref 0–37)
Alkaline Phosphatase: 58 U/L (ref 39–117)
Bilirubin, Direct: 0.2 mg/dL (ref 0.0–0.3)
Total Bilirubin: 0.7 mg/dL (ref 0.2–1.2)
Total Protein: 6.6 g/dL (ref 6.0–8.3)

## 2017-05-14 LAB — HEMOGLOBIN A1C: HEMOGLOBIN A1C: 7.8 % — AB (ref 4.6–6.5)

## 2017-05-14 LAB — BASIC METABOLIC PANEL
BUN: 11 mg/dL (ref 6–23)
CALCIUM: 9.6 mg/dL (ref 8.4–10.5)
CHLORIDE: 101 meq/L (ref 96–112)
CO2: 32 meq/L (ref 19–32)
Creatinine, Ser: 1.05 mg/dL (ref 0.40–1.50)
GFR: 80.7 mL/min (ref >60.00)
Glucose, Bld: 176 mg/dL — ABNORMAL HIGH (ref 70–99)
Potassium: 4.6 mEq/L (ref 3.5–5.1)
SODIUM: 140 meq/L (ref 135–145)

## 2017-05-14 LAB — PSA: PSA: 0.79 ng/mL (ref 0.10–4.00)

## 2017-05-15 MED ORDER — METFORMIN HCL 500 MG PO TABS: 500.0000 mg | ORAL_TABLET | Freq: Two times a day (BID) | ORAL | 3 refills | Status: DC

## 2017-05-17 ENCOUNTER — Other Ambulatory Visit: Payer: Self-pay | Admitting: Internal Medicine

## 2017-05-17 DIAGNOSIS — R945 Abnormal results of liver function studies: Secondary | ICD-10-CM

## 2017-05-17 DIAGNOSIS — R7989 Other specified abnormal findings of blood chemistry: Secondary | ICD-10-CM

## 2017-05-17 NOTE — Progress Notes (Signed)
Order placed for abdominal ultrasound.   

## 2017-05-21 ENCOUNTER — Ambulatory Visit: Payer: BLUE CROSS/BLUE SHIELD

## 2017-05-24 ENCOUNTER — Telehealth: Payer: Self-pay | Admitting: Internal Medicine

## 2017-05-24 NOTE — Telephone Encounter (Signed)
See below message .  He states having blurry vision just when he reads  the computer or phone it is just a little hazy. Patient states thinks it is just smaller finer print.  He has changed his diet however he has been following strict diet.  Hasn't been checking blood sugars.

## 2017-05-24 NOTE — Telephone Encounter (Signed)
Spoke to patient, patient has been informed of Dr. Nicki Reaper statement. He had no questions comments or concerns.

## 2017-05-24 NOTE — Telephone Encounter (Signed)
Pt called and stated that he has noticed the last coupe of days since taking metFORMIN (GLUCOPHAGE) 500 MG tablethe has noticed that his eye sight has been blurry when looking at electronics (cell phone,computers). He is not sure if this is a side effect of the medication. He also wanted Dr. Nicki Reaper know that he does have an eye appointment scheduled for 7/31. Please advise, thank you!  Call pt @ [phone]

## 2017-05-24 NOTE — Telephone Encounter (Signed)
This is the patient we discussed.  If he is noticing a change in vision (both eyes, etc as we discussed) this could be related to his sugar changing.  (sugars improving).  This should improve.  If any acute unusual change  - needs to be evaluated.

## 2017-05-24 NOTE — Telephone Encounter (Signed)
Pt called back returning call. Please advise, thank you!  Call pt @ [phone]

## 2017-07-03 ENCOUNTER — Telehealth: Payer: Self-pay | Admitting: *Deleted

## 2017-07-03 DIAGNOSIS — M25512 Pain in left shoulder: Secondary | ICD-10-CM

## 2017-07-03 NOTE — Telephone Encounter (Signed)
Pt requested a call to discuss further care for his shoulder . Pt stated that he was diagnosed with arthritis . Pt has continuous pain  Pt contact [phone]

## 2017-07-03 NOTE — Telephone Encounter (Signed)
Patient was seen and xray done on 05/08/17. You recommended PT at that time but did not want. Would like to try now. Would like for you to send to whoever you fell is better.

## 2017-07-04 NOTE — Telephone Encounter (Signed)
Order placed for physical therapy referral.  Someone from the office with contact with appt.

## 2017-08-25 NOTE — Progress Notes (Signed)
[facility] [address] [place], [place] [address] Phone: [phone] Subjective:    I'm seeing this patient by the request  of:  Einar Pheasant, MD   CC: Shoulder pain  VVO:[id number]  Michael Lane is a 46 y.o. male coming in with complaint of shoulder pain. Patient has had shoulder pain intermittently over the course last 3 months. Patient has pain at night as he sleeps on that shoulder. He has pain with internal rotation and [employer] press. Patient has had some tingling down the arm as well. Patient states that he has been told that his arm had some arthritis in it. He has been doing massage for about 1 month which seemed to help. No history of shoulder surgery or previous injury.    patient did have x-rays taken on 05/07/2017. Found to have mild degenerative changes of the left acromial clavicular joint but otherwise fairly unremarkable.  Past Medical History:  Diagnosis Date  . Hypertension    Past Surgical History:  Procedure Laterality Date  . VARICOCELE EXCISION     Social History   Social History  . Marital status: Single    Spouse name: N/A  . Number of children: N/A  . Years of education: N/A   Social History Main Topics  . Smoking status: Former Smoker    Packs/day: 1.00    Years: 25.00    Types: Cigarettes  . Smokeless tobacco: Never Used     Comment: Quit smoking in February 2015  . Alcohol use No  . Drug use: No     Comment: past  . Sexual activity: Not on file   Other Topics Concern  . Not on file   Social History Narrative  . No narrative on file   Allergies  Allergen Reactions  . Codeine Nausea And Vomiting  . Shrimp [Shellfish Allergy]    Family History  Problem Relation Age of Onset  . Arthritis Mother   . Cancer Mother        breast  . Breast cancer Mother   . Kidney disease Father        H/O kidney stones  . Hypertension Father      Past medical history, social, surgical and family history all  reviewed in electronic medical record.  No pertanent information unless stated regarding to the chief complaint.   Review of Systems:Review of systems updated and as accurate as of 08/25/17  No headache, visual changes, nausea, vomiting, diarrhea, constipation, dizziness, abdominal pain, skin rash, fevers, chills, night sweats, weight loss, swollen lymph nodes, body aches, joint swelling,chest pain, shortness of breath, mood changes. Positive muscle aches  Objective  There were no vitals taken for this visit. Systems examined below as of 08/25/17   General: No apparent distress alert and oriented x3 mood and affect normal, dressed appropriately.  HEENT: Pupils equal, extraocular movements intact  Respiratory: Patient's speak in full sentences and does not appear short of breath  Cardiovascular: No lower extremity edema, non tender, no erythema  Skin: Warm dry intact with no signs of infection or rash on extremities or on axial skeleton.  Abdomen: Soft nontender  Neuro: Cranial nerves II through XII are intact, neurovascularly intact in all extremities with 2+ DTRs and 2+ pulses.  Lymph: No lymphadenopathy of posterior or anterior cervical chain or axillae bilaterally.  Gait normal with good balance and coordination.  MSK:  Non tender with full range of motion and good stability and symmetric strength and tone  of elbows, wrist, hip, knee and ankles bilaterally.  Shoulder: left Inspection reveals no abnormalities, atrophy or asymmetry. Palpation is normal with no tenderness over AC joint or bicipital groove. ROM is full in all planes passively. Rotator cuff strength normal throughout. signs of impingement with positive Neer and Hawkin's tests, but negative empty can sign. Speeds and Yergason's tests normal. No labral pathology noted with negative Obrien's, negative clunk and good stability. Normal scapular function observed. No painful arc and no drop arm sign. No apprehension sign  MSK  US performed of: left This study was ordered, performed, and interpreted by Charlann Boxer D.O.  Shoulder:   Supraspinatus:  Appears normal on long and transverse views, Bursal bulge seen with shoulder abduction on impingement view. Infraspinatus:  Appears normal on long and transverse views. Significant increase in Doppler flow Subscapularis:  Appears normal on long and transverse views. Positive bursa Teres Minor:  Appears normal on long and transverse views. AC joint:  Capsule undistended, no geyser sign. Glenohumeral Joint:  Appears normal without effusion. Glenoid Labrum:  Intact without visualized tears. Biceps Tendon:  Appears normal on long and transverse views, no fraying of tendon, tendon located in intertubercular groove, no subluxation with shoulder internal or external rotation.  Impression: Subacromial bursitis  Procedure 22449; 15 additional minutes spent for Therapeutic exercises as stated in above notes.  This included exercises focusing on stretching, strengthening, with significant focus on eccentric aspects.   Long term goals include an improvement in range of motion, strength, endurance as well as avoiding reinjury. Patient's frequency would include in 1-2 times a day, 3-5 times a week for a duration of 6-12 weeks. Shoulder Exercises that included:  Basic scapular stabilization to include adduction and depression of scapula Scaption, focusing on proper movement and good control Internal and External rotation utilizing a theraband, with elbow tucked at side entire time Rows with theraband given   Proper technique shown and discussed handout in great detail with ATC.  All questions were discussed and answered.      Impression and Recommendations:     This case required medical decision making of moderate complexity.      Note: This dictation was prepared with Dragon dictation along with smaller phrase technology. Any transcriptional errors that result from this  process are unintentional.       ;l

## 2017-08-26 ENCOUNTER — Ambulatory Visit: Payer: Self-pay

## 2017-08-26 ENCOUNTER — Ambulatory Visit (INDEPENDENT_AMBULATORY_CARE_PROVIDER_SITE_OTHER): Payer: BLUE CROSS/BLUE SHIELD | Admitting: Family Medicine

## 2017-08-26 ENCOUNTER — Encounter: Payer: Self-pay | Admitting: Family Medicine

## 2017-08-26 VITALS — BP 118/80 | HR 72 | Ht 72.0 in | Wt 261.0 lb

## 2017-08-26 DIAGNOSIS — M7552 Bursitis of left shoulder: Secondary | ICD-10-CM | POA: Insufficient documentation

## 2017-08-26 DIAGNOSIS — M25512 Pain in left shoulder: Secondary | ICD-10-CM

## 2017-08-26 MED ORDER — DICLOFENAC SODIUM 2 % TD SOLN: 2.0000 "application " | Freq: Two times a day (BID) | TRANSDERMAL | 3 refills | Status: DC

## 2017-08-26 NOTE — Assessment & Plan Note (Signed)
Shoulder bursitis on the left. Patient is an avid Product manager. Discussed with patient at great length. We discussed topical anti-inflammatories which were prescribed, home exercises and patient work with Product/process development scientist. We discussed which activities to avoid. Over-the-counter medications. Following up again in 4 weeks.

## 2017-08-26 NOTE — Patient Instructions (Addendum)
Good to see you  Alvera Singh is your friend. Ice 20 minutes 2 times daily. Usually after activity and before bed. pennsaid pinkie amount topically 2 times daily as needed.  Keep hands within peripheral vision.  Maybe drop weight about 10-20% for short term as well.  Over the counter get  Vitamin D 2000 IU daily  Turmeric 500mg  daily  See me again in 4 weeks

## 2017-08-27 ENCOUNTER — Other Ambulatory Visit: Payer: Self-pay | Admitting: *Deleted

## 2017-08-27 MED ORDER — DICLOFENAC SODIUM 1 % TD GEL: 2.0000 g | Freq: Three times a day (TID) | TRANSDERMAL | 3 refills | Status: DC | PRN

## 2017-09-10 ENCOUNTER — Encounter: Payer: Self-pay | Admitting: Internal Medicine

## 2017-09-10 ENCOUNTER — Ambulatory Visit (INDEPENDENT_AMBULATORY_CARE_PROVIDER_SITE_OTHER): Payer: BLUE CROSS/BLUE SHIELD | Admitting: Internal Medicine

## 2017-09-10 DIAGNOSIS — E119 Type 2 diabetes mellitus without complications: Secondary | ICD-10-CM | POA: Diagnosis not present

## 2017-09-10 DIAGNOSIS — E7800 Pure hypercholesterolemia, unspecified: Secondary | ICD-10-CM | POA: Diagnosis not present

## 2017-09-10 DIAGNOSIS — M7552 Bursitis of left shoulder: Secondary | ICD-10-CM | POA: Diagnosis not present

## 2017-09-10 DIAGNOSIS — R7989 Other specified abnormal findings of blood chemistry: Secondary | ICD-10-CM

## 2017-09-10 DIAGNOSIS — I10 Essential (primary) hypertension: Secondary | ICD-10-CM | POA: Diagnosis not present

## 2017-09-10 DIAGNOSIS — Z6835 Body mass index (BMI) 35.0-35.9, adult: Secondary | ICD-10-CM | POA: Diagnosis not present

## 2017-09-10 DIAGNOSIS — R945 Abnormal results of liver function studies: Secondary | ICD-10-CM | POA: Diagnosis not present

## 2017-09-10 LAB — HM DIABETES FOOT EXAM

## 2017-09-10 NOTE — Patient Instructions (Signed)
twinrix - hepatitis A and B vaccine.

## 2017-09-10 NOTE — Progress Notes (Signed)
Patient ID: Michael Lane, male   DOB: 30-May-1971, 46 y.o.   MRN: [id number]   Subjective:    Patient ID: Michael Lane, male    DOB: 1971-09-17, 46 y.o.   MRN: [id number]  HPI  Patient here for a scheduled follow up.  Doing well.  Feels good.  Stays active.  Exercises and lifts weights.  States has not been watching his diet as well.  Still watches carbs and plans to get back more serious.  Seeing Dr Tamala Julian for his shoulder.  Note reviewed.  Shoulder is better.  No chest pain.  No sob.  No acid reflux.  No abdominal pain.  Bowels moving.     Past Medical History:  Diagnosis Date  . Hypertension    Past Surgical History:  Procedure Laterality Date  . VARICOCELE EXCISION     Family History  Problem Relation Age of Onset  . Arthritis Mother   . Cancer Mother        breast  . Breast cancer Mother   . Kidney disease Father        H/O kidney stones  . Hypertension Father    Social History   Socioeconomic History  . Marital status: Single    Spouse name: None  . Number of children: None  . Years of education: None  . Highest education level: None  Social Needs  . Financial resource strain: None  . Food insecurity - worry: None  . Food insecurity - inability: None  . Transportation needs - medical: None  . Transportation needs - non-medical: None  Occupational History  . None  Tobacco Use  . Smoking status: Former Smoker    Packs/day: 1.00    Years: 25.00    Pack years: 25.00    Types: Cigarettes  . Smokeless tobacco: Never Used  . Tobacco comment: Quit smoking in February 2015  Substance and Sexual Activity  . Alcohol use: No    Alcohol/week: 0.0 oz  . Drug use: No    Comment: past  . Sexual activity: None  Other Topics Concern  . None  Social History Narrative  . None    Outpatient Encounter Medications as of 09/10/2017  Medication Sig  . amLODipine (NORVASC) 10 MG tablet TAKE 1 TABLET (10 MG TOTAL) BY MOUTH DAILY.  . Diclofenac Sodium (PENNSAID) 2 % SOLN  Place 2 application onto the skin 2 (two) times daily.  . diclofenac sodium (VOLTAREN) 1 % GEL Apply 2 g topically 3 (three) times daily as needed.  Marland Kitchen losartan-hydrochlorothiazide (HYZAAR) 100-25 MG tablet Take 1 tablet by mouth daily.  . metFORMIN (GLUCOPHAGE) 500 MG tablet Take 1 tablet (500 mg total) by mouth 2 (two) times daily with a meal.   No facility-administered encounter medications on file as of 09/10/2017.     Review of Systems  Constitutional: Negative for appetite change and unexpected weight change.  HENT: Negative for congestion and sinus pressure.   Respiratory: Negative for cough, chest tightness and shortness of breath.   Cardiovascular: Negative for chest pain, palpitations and leg swelling.  Gastrointestinal: Negative for abdominal pain, diarrhea and nausea.  Genitourinary: Negative for difficulty urinating and dysuria.  Musculoskeletal: Negative for back pain and joint swelling.       Shoulder is better.   Skin: Negative for color change and rash.  Neurological: Negative for dizziness, light-headedness and headaches.  Psychiatric/Behavioral: Negative for agitation and dysphoric mood.       Objective:     Blood pressure  rechecked by me:  128-130/84  Physical Exam  Constitutional: He appears well-developed and well-nourished. No distress.  HENT:  Nose: Nose normal.  Mouth/Throat: Oropharynx is clear and moist.  Neck: Neck supple. No thyromegaly present.  Cardiovascular: Normal rate and regular rhythm.  Pulmonary/Chest: Effort normal and breath sounds normal. No respiratory distress.  Abdominal: Soft. Bowel sounds are normal. There is no tenderness.  Musculoskeletal: He exhibits no edema or tenderness.  Feet:  No lesions.  DP pulses palpable and equal bilaterally.  Sensation intact to light touch and vibration.   Lymphadenopathy:    He has no cervical adenopathy.  Skin: No rash noted. No erythema.  Psychiatric: He has a normal mood and affect. His behavior  is normal.    BP 130/84   Pulse 76   Temp (!) 97.5 F (36.4 C) (Oral)   Resp 17   Ht 6' 0.05" (1.83 m)   Wt 262 lb (118.8 kg)   SpO2 95%   BMI 35.49 kg/m  Wt Readings from Last 3 Encounters:  09/10/17 262 lb (118.8 kg)  08/26/17 261 lb (118.4 kg)  05/07/17 268 lb 6.4 oz (121.7 kg)     Lab Results  Component Value Date   WBC 6.3 05/14/2017   HGB 15.9 05/14/2017   HCT 46.7 05/14/2017   PLT 256.0 05/14/2017   GLUCOSE 176 (H) 05/14/2017   CHOL 188 05/14/2017   TRIG 113.0 05/14/2017   HDL 44.30 05/14/2017   LDLDIRECT 160.9 08/21/2013   LDLCALC 121 (H) 05/14/2017   ALT 95 (H) 05/14/2017   AST 44 (H) 05/14/2017   NA 140 05/14/2017   K 4.6 05/14/2017   CL 101 05/14/2017   CREATININE 1.05 05/14/2017   BUN 11 05/14/2017   CO2 32 05/14/2017   TSH 1.76 07/27/2016   PSA 0.79 05/14/2017   HGBA1C 7.8 (H) 05/14/2017   MICROALBUR 0.9 05/14/2017       Assessment & Plan:   Problem List Items Addressed This Visit    Abnormal liver function tests    Discussed diet and exercise.  Discussed the need for twinrix.  He will notify me later if agreeable.  Follow liver function tests.        Acute shoulder bursitis, left    Seeing Dr Tamala Julian.  Doing better.  Follow.        BMI 35.0-35.9,adult    Discussed diet and exercise.  Follow.        Diabetes mellitus ([place])    Low carb diet and exercise.  Follow met b and a1c.        Relevant Orders   Hemoglobin A1c   Essential hypertension, benign    Blood pressure rechecked and improved.  Continue same medication regimen.  Follow pressures.  Follow metabolic panel.        Relevant Orders   TSH   Basic metabolic panel   Hyperlipidemia    Low cholesterol diet and exercise.  Follow lipid panel.        Relevant Orders   Lipid panel   Hepatic function panel       Einar Pheasant, MD

## 2017-09-13 ENCOUNTER — Encounter: Payer: Self-pay | Admitting: Internal Medicine

## 2017-09-13 NOTE — Assessment & Plan Note (Signed)
Discussed diet and exercise.  Discussed the need for twinrix.  He will notify me later if agreeable.  Follow liver function tests.

## 2017-09-13 NOTE — Assessment & Plan Note (Signed)
Low cholesterol diet and exercise.  Follow lipid panel.   

## 2017-09-13 NOTE — Assessment & Plan Note (Signed)
Low carb diet and exercise.  Follow met b and a1c.   

## 2017-09-13 NOTE — Assessment & Plan Note (Signed)
Discussed diet and exercise.  Follow.  

## 2017-09-13 NOTE — Assessment & Plan Note (Signed)
Seeing Dr Tamala Julian.  Doing better.  Follow.

## 2017-09-13 NOTE — Assessment & Plan Note (Signed)
Blood pressure rechecked and improved.  Continue same medication regimen.  Follow pressures.  Follow metabolic panel.

## 2017-09-18 ENCOUNTER — Other Ambulatory Visit (INDEPENDENT_AMBULATORY_CARE_PROVIDER_SITE_OTHER): Payer: BLUE CROSS/BLUE SHIELD

## 2017-09-18 DIAGNOSIS — E119 Type 2 diabetes mellitus without complications: Secondary | ICD-10-CM

## 2017-09-18 DIAGNOSIS — E7800 Pure hypercholesterolemia, unspecified: Secondary | ICD-10-CM

## 2017-09-18 DIAGNOSIS — I10 Essential (primary) hypertension: Secondary | ICD-10-CM | POA: Diagnosis not present

## 2017-09-18 LAB — LIPID PANEL
CHOL/HDL RATIO: 4
Cholesterol: 187 mg/dL (ref 0–200)
HDL: 43 mg/dL (ref >39.00)
LDL CALC: 127 mg/dL — AB (ref 0–99)
NONHDL: 144.13
Triglycerides: 84 mg/dL (ref 0.0–149.0)
VLDL: 16.8 mg/dL (ref 0.0–40.0)

## 2017-09-18 LAB — HEPATIC FUNCTION PANEL
ALK PHOS: 44 U/L (ref 39–117)
ALT: 100 U/L — ABNORMAL HIGH (ref 0–53)
AST: 53 U/L — AB (ref 0–37)
Albumin: 4.1 g/dL (ref 3.5–5.2)
Bilirubin, Direct: 0.1 mg/dL (ref 0.0–0.3)
TOTAL PROTEIN: 6.9 g/dL (ref 6.0–8.3)
Total Bilirubin: 0.7 mg/dL (ref 0.2–1.2)

## 2017-09-18 LAB — BASIC METABOLIC PANEL
BUN: 13 mg/dL (ref 6–23)
CHLORIDE: 102 meq/L (ref 96–112)
CO2: 32 mEq/L (ref 19–32)
CREATININE: 0.99 mg/dL (ref 0.40–1.50)
Calcium: 9.5 mg/dL (ref 8.4–10.5)
GFR: 86.24 mL/min (ref >60.00)
GLUCOSE: 133 mg/dL — AB (ref 70–99)
POTASSIUM: 4.4 meq/L (ref 3.5–5.1)
Sodium: 138 mEq/L (ref 135–145)

## 2017-09-18 LAB — HEMOGLOBIN A1C: HEMOGLOBIN A1C: 6.3 % (ref 4.6–6.5)

## 2017-09-18 LAB — TSH: TSH: 2.31 u[IU]/mL (ref 0.35–4.50)

## 2017-09-19 ENCOUNTER — Other Ambulatory Visit: Payer: BLUE CROSS/BLUE SHIELD

## 2017-09-20 ENCOUNTER — Other Ambulatory Visit: Payer: Self-pay | Admitting: Internal Medicine

## 2017-09-20 DIAGNOSIS — R945 Abnormal results of liver function studies: Secondary | ICD-10-CM

## 2017-09-20 DIAGNOSIS — R7989 Other specified abnormal findings of blood chemistry: Secondary | ICD-10-CM

## 2017-09-20 DIAGNOSIS — E119 Type 2 diabetes mellitus without complications: Secondary | ICD-10-CM

## 2017-09-20 NOTE — Progress Notes (Signed)
Order placed for abdominal ultrasound.   

## 2017-09-21 NOTE — Progress Notes (Signed)
[facility] [address] [place], [place] [address] Phone: [phone] Subjective:    I'm seeing this patient by the request  of:    CC: Left shoulder pain  JOA:[id number]  Michael Lane is a 46 y.o. male coming in with complaint of left shoulder pain.  Patient was seen 1 month ago and was diagnosed with a subacromial bursitis.  Patient elected to try conservative therapy including home exercises, icing regimen, topical anti-inflammatories.  Patient states 35% better.  Feels the exercises have been helpful.  Not using the topical anti-inflammatories.  Patient will states that he is making progress.      Past Medical History:  Diagnosis Date  . Hypertension    Past Surgical History:  Procedure Laterality Date  . VARICOCELE EXCISION     Social History   Socioeconomic History  . Marital status: Single    Spouse name: Not on file  . Number of children: Not on file  . Years of education: Not on file  . Highest education level: Not on file  Social Needs  . Financial resource strain: Not on file  . Food insecurity - worry: Not on file  . Food insecurity - inability: Not on file  . Transportation needs - medical: Not on file  . Transportation needs - non-medical: Not on file  Occupational History  . Not on file  Tobacco Use  . Smoking status: Former Smoker    Packs/day: 1.00    Years: 25.00    Pack years: 25.00    Types: Cigarettes  . Smokeless tobacco: Never Used  . Tobacco comment: Quit smoking in February 2015  Substance and Sexual Activity  . Alcohol use: No    Alcohol/week: 0.0 oz  . Drug use: No    Comment: past  . Sexual activity: Not on file  Other Topics Concern  . Not on file  Social History Narrative  . Not on file   Allergies  Allergen Reactions  . Codeine Nausea And Vomiting  . Shrimp [Shellfish Allergy]    Family History  Problem Relation Age of Onset  . Arthritis Mother   . Cancer Mother        breast  . Breast  cancer Mother   . Kidney disease Father        H/O kidney stones  . Hypertension Father      Past medical history, social, surgical and family history all reviewed in electronic medical record.  No pertanent information unless stated regarding to the chief complaint.   Review of Systems:Review of systems updated and as accurate as of 09/21/17  No headache, visual changes, nausea, vomiting, diarrhea, constipation, dizziness, abdominal pain, skin rash, fevers, chills, night sweats, weight loss, swollen lymph nodes, body aches, joint swelling, muscle aches, chest pain, shortness of breath, mood changes.   Objective  There were no vitals taken for this visit. Systems examined below as of 09/21/17   General: No apparent distress alert and oriented x3 mood and affect normal, dressed appropriately.  HEENT: Pupils equal, extraocular movements intact  Respiratory: Patient's speak in full sentences and does not appear short of breath  Cardiovascular: No lower extremity edema, non tender, no erythema  Skin: Warm dry intact with no signs of infection or rash on extremities or on axial skeleton.  Abdomen: Soft nontender  Neuro: Cranial nerves II through XII are intact, neurovascularly intact in all extremities with 2+ DTRs and 2+ pulses.  Lymph: No lymphadenopathy of posterior  or anterior cervical chain or axillae bilaterally.  Gait normal with good balance and coordination.  MSK:  Non tender with full range of motion and good stability and symmetric strength and tone of  elbows, wrist, hip, knee and ankles bilaterally.  Shoulder: Left Inspection reveals no abnormalities, atrophy or asymmetry. Palpation is normal with no tenderness over AC joint or bicipital groove. ROM is full in all planes. Rotator cuff strength normal throughout. Positive impingement Speeds and Yergason's tests normal. No labral pathology noted with negative Obrien's, negative clunk and good stability.  Normal scapular  function observed. No painful arc and no drop arm sign. No apprehension sign Contralateral shoulder unremarkable     Impression and Recommendations:     This case required medical decision making of moderate complexity.      Note: This dictation was prepared with Dragon dictation along with smaller phrase technology. Any transcriptional errors that result from this process are unintentional.

## 2017-09-23 ENCOUNTER — Ambulatory Visit: Payer: BLUE CROSS/BLUE SHIELD | Admitting: Family Medicine

## 2017-09-23 ENCOUNTER — Encounter: Payer: Self-pay | Admitting: Family Medicine

## 2017-09-23 DIAGNOSIS — M7552 Bursitis of left shoulder: Secondary | ICD-10-CM | POA: Diagnosis not present

## 2017-09-23 NOTE — Patient Instructions (Signed)
Good to see you  Keep it up  You are doing great overall.  You know where I am if you need me.  [phone]

## 2017-09-23 NOTE — Assessment & Plan Note (Signed)
Patient is doing significantly better at this time.  We discussed icing regimen and home exercises.  We discussed continuing the conservative therapy.  We also have other possibilities including injections, formal physical therapy or nitroglycerin patch.  Patient wants to continue with the same plan at this time and follow-up as needed

## 2017-09-24 ENCOUNTER — Ambulatory Visit: Payer: BLUE CROSS/BLUE SHIELD | Admitting: Internal Medicine

## 2017-09-24 ENCOUNTER — Encounter: Payer: Self-pay | Admitting: *Deleted

## 2017-09-24 ENCOUNTER — Telehealth: Payer: Self-pay | Admitting: Internal Medicine

## 2017-09-24 VITALS — BP 130/88 | HR 78 | Temp 98.2°F | Resp 18 | Ht 72.0 in | Wt 257.5 lb

## 2017-09-24 DIAGNOSIS — I10 Essential (primary) hypertension: Secondary | ICD-10-CM | POA: Diagnosis not present

## 2017-09-24 DIAGNOSIS — J329 Chronic sinusitis, unspecified: Secondary | ICD-10-CM | POA: Diagnosis not present

## 2017-09-24 MED ORDER — AMOXICILLIN 875 MG PO TABS: 875.0000 mg | ORAL_TABLET | Freq: Two times a day (BID) | ORAL | 0 refills | Status: DC

## 2017-09-24 NOTE — Telephone Encounter (Signed)
I can work him in during lunch if can come in.  Have him be here at 12:15.  May have to wait - since being worked in.

## 2017-09-24 NOTE — Telephone Encounter (Signed)
Can we call something in for him or should I advise walk-in/urgent care?

## 2017-09-24 NOTE — Telephone Encounter (Signed)
Patient scheduled.

## 2017-09-24 NOTE — Telephone Encounter (Signed)
Copied from [place] [id number]. Topic: Inquiry >> Sep 24, 2017  8:02 AM Scherrie Gerlach wrote: Reason for CRM: pt would like to know if Dr Nicki Reaper will call in a abx for a sinus infection. Pt has sinus pressure, congestion, sneezing, drainage. Pt states he was just seen last week for a follow up.  Pt declined appt this time.  [id number] [address], [place] (Phone) [phone] (Fax)

## 2017-09-24 NOTE — Progress Notes (Signed)
Patient ID: Laurence Ferrari, male   DOB: 1971-08-10, 46 y.o.   MRN: [id number]   Subjective:    Patient ID: Laurence Ferrari, male    DOB: 19-Feb-1971, 46 y.o.   MRN: [id number]  HPI  Patient here as a work in with concerns regarding increased congestion.  States started with some change in ear sensation last week.  Several days ago, symptoms worsened.  Increased sinus pressure and nasal congestion.  Chills.  Some aching.  No chest congestion.  No sob.  No acid reflux.  Taking sudafed.     Past Medical History:  Diagnosis Date  . Hypertension    Past Surgical History:  Procedure Laterality Date  . VARICOCELE EXCISION     Family History  Problem Relation Age of Onset  . Arthritis Mother   . Cancer Mother        breast  . Breast cancer Mother   . Kidney disease Father        H/O kidney stones  . Hypertension Father    Social History   Socioeconomic History  . Marital status: Single    Spouse name: None  . Number of children: None  . Years of education: None  . Highest education level: None  Social Needs  . Financial resource strain: None  . Food insecurity - worry: None  . Food insecurity - inability: None  . Transportation needs - medical: None  . Transportation needs - non-medical: None  Occupational History  . None  Tobacco Use  . Smoking status: Former Smoker    Packs/day: 1.00    Years: 25.00    Pack years: 25.00    Types: Cigarettes  . Smokeless tobacco: Never Used  . Tobacco comment: Quit smoking in February 2015  Substance and Sexual Activity  . Alcohol use: No    Alcohol/week: 0.0 oz  . Drug use: No    Comment: past  . Sexual activity: None  Other Topics Concern  . None  Social History Narrative  . None    Outpatient Encounter Medications as of 09/24/2017  Medication Sig  . amLODipine (NORVASC) 10 MG tablet TAKE 1 TABLET (10 MG TOTAL) BY MOUTH DAILY.  . Diclofenac Sodium (PENNSAID) 2 % SOLN Place 2 application onto the skin 2 (two) times daily.  .  diclofenac sodium (VOLTAREN) 1 % GEL Apply 2 g topically 3 (three) times daily as needed.  Marland Kitchen losartan-hydrochlorothiazide (HYZAAR) 100-25 MG tablet Take 1 tablet by mouth daily.  . metFORMIN (GLUCOPHAGE) 500 MG tablet Take 1 tablet (500 mg total) by mouth 2 (two) times daily with a meal.  . amoxicillin (AMOXIL) 875 MG tablet Take 1 tablet (875 mg total) by mouth 2 (two) times daily.   No facility-administered encounter medications on file as of 09/24/2017.     Review of Systems  Constitutional: Positive for chills. Negative for appetite change.  HENT: Positive for congestion and sinus pressure.   Respiratory: Negative for cough, chest tightness and shortness of breath.   Gastrointestinal: Negative for diarrhea, nausea and vomiting.  Skin: Negative for rash.  Neurological: Negative for dizziness, light-headedness and headaches.       Objective:    Physical Exam  Constitutional: He appears well-developed and well-nourished. No distress.  HENT:  Mouth/Throat: Oropharynx is clear and moist.  Nares:  Slightly erythematous turbinates.  Minimal tenderness to palpation over the sinuses.    Eyes: Conjunctivae are normal. Right eye exhibits no discharge. Left eye exhibits no discharge.  Neck: Neck  supple.  Cardiovascular: Normal rate and regular rhythm.  Pulmonary/Chest: Effort normal and breath sounds normal. No respiratory distress.  Lymphadenopathy:    He has no cervical adenopathy.  Skin: No rash noted. No erythema.  Psychiatric: He has a normal mood and affect. His behavior is normal.    BP 130/88   Pulse 78   Temp 98.2 F (36.8 C) (Oral)   Resp 18   Ht 6' (1.829 m)   Wt 257 lb 8 oz (116.8 kg)   SpO2 96%   BMI 34.92 kg/m  Wt Readings from Last 3 Encounters:  09/24/17 257 lb 8 oz (116.8 kg)  09/23/17 259 lb (117.5 kg)  09/10/17 262 lb (118.8 kg)     Lab Results  Component Value Date   WBC 6.3 05/14/2017   HGB 15.9 05/14/2017   HCT 46.7 05/14/2017   PLT 256.0  05/14/2017   GLUCOSE 133 (H) 09/18/2017   CHOL 187 09/18/2017   TRIG 84.0 09/18/2017   HDL 43.00 09/18/2017   LDLDIRECT 160.9 08/21/2013   LDLCALC 127 (H) 09/18/2017   ALT 100 (H) 09/18/2017   AST 53 (H) 09/18/2017   NA 138 09/18/2017   K 4.4 09/18/2017   CL 102 09/18/2017   CREATININE 0.99 09/18/2017   BUN 13 09/18/2017   CO2 32 09/18/2017   TSH 2.31 09/18/2017   PSA 0.79 05/14/2017   HGBA1C 6.3 09/18/2017   MICROALBUR 0.9 05/14/2017       Assessment & Plan:   Problem List Items Addressed This Visit    Essential hypertension, benign    Blood pressure slightly elevated today.  Treat infection.  Follow pressures.        Other Visit Diagnoses    Sinusitis, unspecified chronicity, unspecified location    -  Primary   Symptoms as outlined.  treat with nasacort nasal spray  Mucinex/robitussin as directed.  if persistent symptoms, amoxicillin 875mg  bid.  follow.     Relevant Medications   amoxicillin (AMOXIL) 875 MG tablet       Einar Pheasant, MD

## 2017-09-24 NOTE — Patient Instructions (Signed)
mucinex - one tablet per day in the morning as needed  Robitussin in the evening as needed  nasacort nasal spray - 2 sprays each nostril one time per day.  Do this in the evening.    Take a probiotic daily while you are on the antibiotics and for two weeks after completing the antibiotic.    Examples of probiotics:  Florastor, culturelle, align.

## 2017-09-27 ENCOUNTER — Encounter: Payer: Self-pay | Admitting: Internal Medicine

## 2017-09-27 NOTE — Assessment & Plan Note (Signed)
Blood pressure slightly elevated today.  Treat infection.  Follow pressures.

## 2017-10-01 ENCOUNTER — Telehealth: Payer: Self-pay

## 2017-10-01 ENCOUNTER — Ambulatory Visit: Payer: BLUE CROSS/BLUE SHIELD

## 2017-10-01 NOTE — Telephone Encounter (Signed)
FYI  Copied from [place]. Topic: Referral - Status >> Oct 01, 2017  8:35 AM Scherrie Gerlach wrote: Reason for CRM: pt states he went for ultrasound this this am and was told his part would be $500.  Pt states he cannot pay that and wants Dr Nicki Reaper to know why he did not get this done.

## 2018-01-09 ENCOUNTER — Encounter: Payer: Self-pay | Admitting: Internal Medicine

## 2018-01-09 ENCOUNTER — Ambulatory Visit: Payer: BLUE CROSS/BLUE SHIELD | Admitting: Internal Medicine

## 2018-01-09 VITALS — BP 132/84 | HR 78 | Temp 98.3°F | Resp 18 | Wt 259.8 lb

## 2018-01-09 DIAGNOSIS — Z6835 Body mass index (BMI) 35.0-35.9, adult: Secondary | ICD-10-CM

## 2018-01-09 DIAGNOSIS — R945 Abnormal results of liver function studies: Secondary | ICD-10-CM | POA: Diagnosis not present

## 2018-01-09 DIAGNOSIS — E7800 Pure hypercholesterolemia, unspecified: Secondary | ICD-10-CM

## 2018-01-09 DIAGNOSIS — I10 Essential (primary) hypertension: Secondary | ICD-10-CM

## 2018-01-09 DIAGNOSIS — R7989 Other specified abnormal findings of blood chemistry: Secondary | ICD-10-CM

## 2018-01-09 DIAGNOSIS — E119 Type 2 diabetes mellitus without complications: Secondary | ICD-10-CM

## 2018-01-09 LAB — BASIC METABOLIC PANEL
BUN: 13 mg/dL (ref 6–23)
CALCIUM: 9.6 mg/dL (ref 8.4–10.5)
CO2: 31 mEq/L (ref 19–32)
Chloride: 102 mEq/L (ref 96–112)
Creatinine, Ser: 1.02 mg/dL (ref 0.40–1.50)
GFR: 83.2 mL/min (ref >60.00)
GLUCOSE: 132 mg/dL — AB (ref 70–99)
POTASSIUM: 4.2 meq/L (ref 3.5–5.1)
SODIUM: 138 meq/L (ref 135–145)

## 2018-01-09 LAB — HEPATIC FUNCTION PANEL
ALT: 167 U/L — AB (ref 0–53)
AST: 105 U/L — AB (ref 0–37)
Albumin: 4.4 g/dL (ref 3.5–5.2)
Alkaline Phosphatase: 54 U/L (ref 39–117)
BILIRUBIN DIRECT: 0.2 mg/dL (ref 0.0–0.3)
BILIRUBIN TOTAL: 0.8 mg/dL (ref 0.2–1.2)
Total Protein: 7.2 g/dL (ref 6.0–8.3)

## 2018-01-09 LAB — LIPID PANEL
CHOL/HDL RATIO: 4
CHOLESTEROL: 191 mg/dL (ref 0–200)
HDL: 45 mg/dL (ref >39.00)
LDL CALC: 126 mg/dL — AB (ref 0–99)
NonHDL: 145.52
TRIGLYCERIDES: 100 mg/dL (ref 0.0–149.0)
VLDL: 20 mg/dL (ref 0.0–40.0)

## 2018-01-09 LAB — HEMOGLOBIN A1C: HEMOGLOBIN A1C: 7 % — AB (ref 4.6–6.5)

## 2018-01-09 NOTE — Progress Notes (Signed)
Patient ID: Michael Lane, male   DOB: 01/17/1971, 47 y.o.   MRN: [id number]   Subjective:    Patient ID: Michael Lane, male    DOB: 11-06-1970, 47 y.o.   MRN: [id number]  HPI  Patient here for a scheduled follow up.  States he is doing well.  Has adjusted his diet.  Has lost weight.  Is exercising regularly.  No chest pain.  No sob.  No acid reflux.  No abdominal pain.  Bowels moving.  Left shoulder pain resolved. Some right shoulder pain, but has improved with exercises.  Off metformin.  Stopped one month ago.  Does not want to take.  Has adjusted diet as outlined.  In a new relationship.  Going well.  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 Overall feels good.     Past Medical History:  Diagnosis Date  . Hypertension    Past Surgical History:  Procedure Laterality Date  . VARICOCELE EXCISION     Family History  Problem Relation Age of Onset  . Arthritis Mother   . Cancer Mother        breast  . Breast cancer Mother   . Kidney disease Father        H/O kidney stones  . Hypertension Father    Social History   Socioeconomic History  . Marital status: Single    Spouse name: None  . Number of children: None  . Years of education: None  . Highest education level: None  Social Needs  . Financial resource strain: None  . Food insecurity - worry: None  . Food insecurity - inability: None  . Transportation needs - medical: None  . Transportation needs - non-medical: None  Occupational History  . None  Tobacco Use  . Smoking status: Former Smoker    Packs/day: 1.00    Years: 25.00    Pack years: 25.00    Types: Cigarettes  . Smokeless tobacco: Never Used  . Tobacco comment: Quit smoking in February 2015  Substance and Sexual Activity  . Alcohol use: No    Alcohol/week: 0.0 oz  . Drug use: No    Comment: past  . Sexual activity: None  Other Topics Concern  . None  Social History Narrative  . None    Outpatient Encounter Medications as of 01/09/2018    Medication Sig  . amLODipine (NORVASC) 10 MG tablet TAKE 1 TABLET (10 MG TOTAL) BY MOUTH DAILY.  Marland Kitchen amoxicillin (AMOXIL) 875 MG tablet Take 1 tablet (875 mg total) by mouth 2 (two) times daily. (Patient not taking: Reported on 01/09/2018)  . Diclofenac Sodium (PENNSAID) 2 % SOLN Place 2 application onto the skin 2 (two) times daily.  . diclofenac sodium (VOLTAREN) 1 % GEL Apply 2 g topically 3 (three) times daily as needed.  Marland Kitchen losartan-hydrochlorothiazide (HYZAAR) 100-25 MG tablet Take 1 tablet by mouth daily.  . metFORMIN (GLUCOPHAGE) 500 MG tablet Take 1 tablet (500 mg total) by mouth 2 (two) times daily with a meal. (Patient not taking: Reported on 01/09/2018)   No facility-administered encounter medications on file as of 01/09/2018.     Review of Systems  Constitutional: Negative for appetite change and unexpected weight change.  HENT: Negative for congestion and sinus pressure.   Respiratory: Negative for cough, chest tightness and shortness of breath.   Cardiovascular: Negative for chest pain, palpitations and leg swelling.  Gastrointestinal: Negative for abdominal pain, diarrhea, nausea and vomiting.  Genitourinary: Negative for difficulty urinating and  dysuria.  Musculoskeletal: Negative for joint swelling and myalgias.  Skin: Negative for color change and rash.  Neurological: Negative for dizziness, light-headedness and headaches.  Psychiatric/Behavioral: Negative for agitation and dysphoric mood.       Objective:     Blood pressure rechecked by me:  132/84  Physical Exam  Constitutional: He appears well-developed and well-nourished. No distress.  HENT:  Nose: Nose normal.  Mouth/Throat: Oropharynx is clear and moist.  Neck: Neck supple. No thyromegaly present.  Cardiovascular: Normal rate and regular rhythm.  Pulmonary/Chest: Effort normal and breath sounds normal. No respiratory distress.  Abdominal: Soft. Bowel sounds are normal. There is no tenderness.  Musculoskeletal:  He exhibits no edema or tenderness.  Lymphadenopathy:    He has no cervical adenopathy.  Skin: No rash noted. No erythema.  Psychiatric: He has a normal mood and affect. His behavior is normal.    BP 132/84   Pulse 78   Temp 98.3 F (36.8 C) (Oral)   Resp 18   Wt 259 lb 12.8 oz (117.8 kg)   SpO2 96%   BMI 35.24 kg/m  Wt Readings from Last 3 Encounters:  01/09/18 259 lb 12.8 oz (117.8 kg)  09/24/17 257 lb 8 oz (116.8 kg)  09/23/17 259 lb (117.5 kg)     Lab Results  Component Value Date   WBC 6.3 05/14/2017   HGB 15.9 05/14/2017   HCT 46.7 05/14/2017   PLT 256.0 05/14/2017   GLUCOSE 132 (H) 01/09/2018   CHOL 191 01/09/2018   TRIG 100.0 01/09/2018   HDL 45.00 01/09/2018   LDLDIRECT 160.9 08/21/2013   LDLCALC 126 (H) 01/09/2018   ALT 167 (H) 01/09/2018   AST 105 (H) 01/09/2018   NA 138 01/09/2018   K 4.2 01/09/2018   CL 102 01/09/2018   CREATININE 1.02 01/09/2018   BUN 13 01/09/2018   CO2 31 01/09/2018   TSH 2.31 09/18/2017   PSA 0.79 05/14/2017   HGBA1C 7.0 (H) 01/09/2018   MICROALBUR 0.9 05/14/2017       Assessment & Plan:   Problem List Items Addressed This Visit    Abnormal liver function tests - Primary    Previously saw GI.  Discussed continued diet and exercise.  Follow liver panel.       Relevant Orders   Hepatic function panel (Completed)   BMI 35.0-35.9,adult    Diet and exercise.  Follow.        Diabetes mellitus ([place])    Off metformin.  Watching his diet.  Is exercising.  Has lost some weight.  Check met b and a1c.        Relevant Orders   Hemoglobin A1c (Completed)   Basic metabolic panel (Completed)   Essential hypertension, benign    Blood pressure rechecked - improved.  Same medication regimen.  Follow pressures.  Follow metabolic panel.        Hyperlipidemia    Low cholesterol diet and exercise.  Follow lipid panel.        Relevant Orders   Lipid panel (Completed)       Einar Pheasant, MD

## 2018-01-10 ENCOUNTER — Encounter: Payer: Self-pay | Admitting: Internal Medicine

## 2018-01-11 ENCOUNTER — Encounter: Payer: Self-pay | Admitting: Internal Medicine

## 2018-01-11 NOTE — Assessment & Plan Note (Signed)
Previously saw GI.  Discussed continued diet and exercise.  Follow liver panel.

## 2018-01-11 NOTE — Assessment & Plan Note (Signed)
Low cholesterol diet and exercise.  Follow lipid panel.   

## 2018-01-11 NOTE — Assessment & Plan Note (Signed)
Diet and exercise.  Follow.  

## 2018-01-11 NOTE — Assessment & Plan Note (Signed)
Blood pressure rechecked - improved.  Same medication regimen.  Follow pressures.  Follow metabolic panel.

## 2018-01-11 NOTE — Assessment & Plan Note (Signed)
Off metformin.  Watching his diet.  Is exercising.  Has lost some weight.  Check met b and a1c.

## 2018-01-13 ENCOUNTER — Other Ambulatory Visit: Payer: Self-pay | Admitting: Internal Medicine

## 2018-05-27 ENCOUNTER — Ambulatory Visit (INDEPENDENT_AMBULATORY_CARE_PROVIDER_SITE_OTHER): Payer: BLUE CROSS/BLUE SHIELD | Admitting: Internal Medicine

## 2018-05-27 ENCOUNTER — Encounter: Payer: Self-pay | Admitting: Internal Medicine

## 2018-05-27 VITALS — BP 134/88 | HR 73 | Temp 98.0°F | Resp 18 | Ht 72.0 in | Wt 251.0 lb

## 2018-05-27 DIAGNOSIS — E7800 Pure hypercholesterolemia, unspecified: Secondary | ICD-10-CM | POA: Diagnosis not present

## 2018-05-27 DIAGNOSIS — I10 Essential (primary) hypertension: Secondary | ICD-10-CM

## 2018-05-27 DIAGNOSIS — Z125 Encounter for screening for malignant neoplasm of prostate: Secondary | ICD-10-CM | POA: Diagnosis not present

## 2018-05-27 DIAGNOSIS — Z0000 Encounter for general adult medical examination without abnormal findings: Secondary | ICD-10-CM

## 2018-05-27 DIAGNOSIS — R945 Abnormal results of liver function studies: Secondary | ICD-10-CM | POA: Diagnosis not present

## 2018-05-27 DIAGNOSIS — R7989 Other specified abnormal findings of blood chemistry: Secondary | ICD-10-CM

## 2018-05-27 DIAGNOSIS — E119 Type 2 diabetes mellitus without complications: Secondary | ICD-10-CM | POA: Diagnosis not present

## 2018-05-27 LAB — CBC WITH DIFFERENTIAL/PLATELET
BASOS ABS: 0 10*3/uL (ref 0.0–0.1)
BASOS PCT: 0.7 % (ref 0.0–3.0)
EOS PCT: 2.1 % (ref 0.0–5.0)
Eosinophils Absolute: 0.1 10*3/uL (ref 0.0–0.7)
HCT: 46.5 % (ref 39.0–52.0)
Hemoglobin: 15.9 g/dL (ref 13.0–17.0)
Lymphocytes Relative: 46.1 % — ABNORMAL HIGH (ref 12.0–46.0)
Lymphs Abs: 2.6 10*3/uL (ref 0.7–4.0)
MCHC: 34.1 g/dL (ref 30.0–36.0)
MCV: 94.7 fl (ref 78.0–100.0)
MONOS PCT: 6.1 % (ref 3.0–12.0)
Monocytes Absolute: 0.3 10*3/uL (ref 0.1–1.0)
NEUTROS ABS: 2.6 10*3/uL (ref 1.4–7.7)
NEUTROS PCT: 45 % (ref 43.0–77.0)
PLATELETS: 247 10*3/uL (ref 150.0–400.0)
RBC: 4.91 Mil/uL (ref 4.22–5.81)
RDW: 13.8 % (ref 11.5–15.5)
WBC: 5.7 10*3/uL (ref 4.0–10.5)

## 2018-05-27 LAB — PSA: PSA: 0.79 ng/mL (ref 0.10–4.00)

## 2018-05-27 LAB — LIPID PANEL
Cholesterol: 190 mg/dL (ref 0–200)
HDL: 37.4 mg/dL — AB (ref >39.00)
LDL Cholesterol: 134 mg/dL — ABNORMAL HIGH (ref 0–99)
NONHDL: 152.37
TRIGLYCERIDES: 91 mg/dL (ref 0.0–149.0)
Total CHOL/HDL Ratio: 5
VLDL: 18.2 mg/dL (ref 0.0–40.0)

## 2018-05-27 LAB — BASIC METABOLIC PANEL
BUN: 11 mg/dL (ref 6–23)
CHLORIDE: 104 meq/L (ref 96–112)
CO2: 32 meq/L (ref 19–32)
Calcium: 9.1 mg/dL (ref 8.4–10.5)
Creatinine, Ser: 1.06 mg/dL (ref 0.40–1.50)
GFR: 79.46 mL/min (ref >60.00)
GLUCOSE: 129 mg/dL — AB (ref 70–99)
Potassium: 4.7 mEq/L (ref 3.5–5.1)
SODIUM: 140 meq/L (ref 135–145)

## 2018-05-27 LAB — HEPATIC FUNCTION PANEL
ALBUMIN: 4.2 g/dL (ref 3.5–5.2)
ALK PHOS: 82 U/L (ref 39–117)
ALT: 104 U/L — ABNORMAL HIGH (ref 0–53)
AST: 47 U/L — ABNORMAL HIGH (ref 0–37)
Bilirubin, Direct: 0.1 mg/dL (ref 0.0–0.3)
TOTAL PROTEIN: 6.8 g/dL (ref 6.0–8.3)
Total Bilirubin: 0.5 mg/dL (ref 0.2–1.2)

## 2018-05-27 LAB — MICROALBUMIN / CREATININE URINE RATIO
CREATININE, U: 222.3 mg/dL
MICROALB/CREAT RATIO: 0.4 mg/g (ref 0.0–30.0)
Microalb, Ur: 0.9 mg/dL (ref 0.0–1.9)

## 2018-05-27 LAB — HEMOGLOBIN A1C: HEMOGLOBIN A1C: 6.3 % (ref 4.6–6.5)

## 2018-05-27 MED ORDER — LOSARTAN POTASSIUM-HCTZ 100-25 MG PO TABS: 1.0000 | ORAL_TABLET | Freq: Every day | ORAL | 3 refills | Status: DC

## 2018-05-27 MED ORDER — METFORMIN HCL 500 MG PO TABS: 500.0000 mg | ORAL_TABLET | Freq: Two times a day (BID) | ORAL | 3 refills | Status: DC

## 2018-05-27 MED ORDER — AMLODIPINE BESYLATE 10 MG PO TABS
ORAL_TABLET | ORAL | 3 refills | Status: DC
Start: 2018-05-27 — End: 2019-07-17

## 2018-05-27 NOTE — Assessment & Plan Note (Signed)
Blood pressure elevated today.  Better on recheck.  Has been off medication.  Will restart.  Spot check his pressure.  Call in readings over the next few weeks.

## 2018-05-27 NOTE — Assessment & Plan Note (Signed)
Previously saw GI.  Has adjusted diet.  Lost weight.  Recheck liver panel.

## 2018-05-27 NOTE — Assessment & Plan Note (Signed)
Low cholesterol diet and exercise.  Follow lipid panel.   

## 2018-05-27 NOTE — Assessment & Plan Note (Addendum)
Physical today 05/27/18.  Check psa.

## 2018-05-27 NOTE — Progress Notes (Signed)
Patient ID: Michael Lane, male   DOB: [birth year], 47 y.o.   MRN: [id number]   Subjective:    Patient ID: Michael Lane, male    DOB: August 24, 1971, 47 y.o.   MRN: [id number]  HPI  Patient here for his physical exam.  He reports he is doing well.  Feels good.  Watching his diet.  Has lost weight.  Is exercising.  No chest pain.  No sob.  No acid reflux.  No abdominal pain.  Bowels moving.  No urine change.  Work going well.  Relationship going well.  Still doing his prison ministry. Overall feels good.  Has not taken his blood pressure medication for several days.  Also, has been off one of the medications for a while.  Blood pressure elevated today.  Plans to restart.  Not checking his sugars. Up to date with eye checks.  States last check approximately 10 months ago.     Past Medical History:  Diagnosis Date  . Hypertension    Past Surgical History:  Procedure Laterality Date  . VARICOCELE EXCISION     Family History  Problem Relation Age of Onset  . Arthritis Mother   . Cancer Mother        breast  . Breast cancer Mother   . Kidney disease Father        H/O kidney stones  . Hypertension Father    Social History   Socioeconomic History  . Marital status: Single    Spouse name: Not on file  . Number of children: Not on file  . Years of education: Not on file  . Highest education level: Not on file  Occupational History  . Not on file  Social Needs  . Financial resource strain: Not on file  . Food insecurity:    Worry: Not on file    Inability: Not on file  . Transportation needs:    Medical: Not on file    Non-medical: Not on file  Tobacco Use  . Smoking status: Former Smoker    Packs/day: 1.00    Years: 25.00    Pack years: 25.00    Types: Cigarettes  . Smokeless tobacco: Never Used  . Tobacco comment: Quit smoking in February 2015  Substance and Sexual Activity  . Alcohol use: No    Alcohol/week: 0.0 oz  . Drug use: No    Types: Marijuana    Comment: past    . Sexual activity: Not on file  Lifestyle  . Physical activity:    Days per week: Not on file    Minutes per session: Not on file  . Stress: Not on file  Relationships  . Social connections:    Talks on phone: Not on file    Gets together: Not on file    Attends religious service: Not on file    Active member of club or organization: Not on file    Attends meetings of clubs or organizations: Not on file    Relationship status: Not on file  Other Topics Concern  . Not on file  Social History Narrative  . Not on file    Outpatient Encounter Medications as of 05/27/2018  Medication Sig  . amLODipine (NORVASC) 10 MG tablet TAKE 1 TABLET BY MOUTH EVERY DAY  . Diclofenac Sodium (PENNSAID) 2 % SOLN Place 2 application onto the skin 2 (two) times daily.  . diclofenac sodium (VOLTAREN) 1 % GEL Apply 2 g topically 3 (three) times daily as needed.  Marland Kitchen  losartan-hydrochlorothiazide (HYZAAR) 100-25 MG tablet Take 1 tablet by mouth daily.  . metFORMIN (GLUCOPHAGE) 500 MG tablet Take 1 tablet (500 mg total) by mouth 2 (two) times daily with a meal.  . [DISCONTINUED] amLODipine (NORVASC) 10 MG tablet TAKE 1 TABLET BY MOUTH EVERY DAY  . [DISCONTINUED] losartan-hydrochlorothiazide (HYZAAR) 100-25 MG tablet Take 1 tablet by mouth daily.  . [DISCONTINUED] metFORMIN (GLUCOPHAGE) 500 MG tablet Take 1 tablet (500 mg total) by mouth 2 (two) times daily with a meal.  . [DISCONTINUED] amoxicillin (AMOXIL) 875 MG tablet Take 1 tablet (875 mg total) by mouth 2 (two) times daily. (Patient not taking: Reported on 01/09/2018)   No facility-administered encounter medications on file as of 05/27/2018.     Review of Systems  Constitutional: Negative for appetite change and unexpected weight change.       Is watching his diet.  Has lost weight.   HENT: Negative for congestion and sinus pressure.   Eyes: Negative for pain and visual disturbance.  Respiratory: Negative for cough, chest tightness and shortness of  breath.   Cardiovascular: Negative for chest pain, palpitations and leg swelling.  Gastrointestinal: Negative for abdominal pain, diarrhea, nausea and vomiting.  Genitourinary: Negative for difficulty urinating and dysuria.  Musculoskeletal: Negative for joint swelling and myalgias.       Shoulder better.  Doing home exercises.    Skin: Negative for color change and rash.  Neurological: Negative for dizziness.  Hematological: Negative for adenopathy. Does not bruise/bleed easily.  Psychiatric/Behavioral: Negative for agitation and dysphoric mood.       Objective:     Blood pressure rechecked by me:  134/88  Physical Exam  Constitutional: He is oriented to person, place, and time. He appears well-developed and well-nourished. No distress.  HENT:  Head: Normocephalic and atraumatic.  Nose: Nose normal.  Mouth/Throat: Oropharynx is clear and moist. No oropharyngeal exudate.  Eyes: Conjunctivae are normal. Right eye exhibits no discharge. Left eye exhibits no discharge.  Neck: Neck supple. No thyromegaly present.  Cardiovascular: Normal rate and regular rhythm.  Pulmonary/Chest: Breath sounds normal. No respiratory distress. He has no wheezes.  Abdominal: Soft. Bowel sounds are normal. There is no tenderness.  Genitourinary:  Genitourinary Comments: Not performed.    Musculoskeletal: He exhibits no edema or tenderness.  Lymphadenopathy:    He has no cervical adenopathy.  Neurological: He is alert and oriented to person, place, and time.  Skin: No rash noted. No erythema.  Psychiatric: He has a normal mood and affect. His behavior is normal.    BP 134/88   Pulse 73   Temp 98 F (36.7 C) (Oral)   Resp 18   Ht 6' (1.829 m)   Wt 251 lb (113.9 kg)   SpO2 96%   BMI 34.04 kg/m  Wt Readings from Last 3 Encounters:  05/27/18 251 lb (113.9 kg)  01/09/18 259 lb 12.8 oz (117.8 kg)  09/24/17 257 lb 8 oz (116.8 kg)     Lab Results  Component Value Date   WBC 5.7 05/27/2018    HGB 15.9 05/27/2018   HCT 46.5 05/27/2018   PLT 247.0 05/27/2018   GLUCOSE 129 (H) 05/27/2018   CHOL 190 05/27/2018   TRIG 91.0 05/27/2018   HDL 37.40 (L) 05/27/2018   LDLDIRECT 160.9 08/21/2013   LDLCALC 134 (H) 05/27/2018   ALT 104 (H) 05/27/2018   AST 47 (H) 05/27/2018   NA 140 05/27/2018   K 4.7 05/27/2018   CL 104 05/27/2018   CREATININE  1.06 05/27/2018   BUN 11 05/27/2018   CO2 32 05/27/2018   TSH 2.31 09/18/2017   PSA 0.79 05/27/2018   HGBA1C 6.3 05/27/2018   MICROALBUR 0.9 05/27/2018       Assessment & Plan:   Problem List Items Addressed This Visit    Abnormal liver function tests    Previously saw GI.  Has adjusted diet.  Lost weight.  Recheck liver panel.        Relevant Orders   Hepatic function panel (Completed)   Diabetes mellitus ([place])    Low carb diet and exercise.  He has adjusted his diet.  Has lost weight.  Up to date with eye checks.  Follow met b and a1c.  On metformin.        Relevant Medications   losartan-hydrochlorothiazide (HYZAAR) 100-25 MG tablet   metFORMIN (GLUCOPHAGE) 500 MG tablet   Other Relevant Orders   Hemoglobin A1c (Completed)   Microalbumin / creatinine urine ratio (Completed)   Essential hypertension, benign - Primary    Blood pressure elevated today.  Better on recheck.  Has been off medication.  Will restart.  Spot check his pressure.  Call in readings over the next few weeks.        Relevant Medications   amLODipine (NORVASC) 10 MG tablet   losartan-hydrochlorothiazide (HYZAAR) 100-25 MG tablet   Other Relevant Orders   CBC with Differential/Platelet (Completed)   Basic metabolic panel (Completed)   Health care maintenance    Physical today 05/27/18.  Check psa.       Hyperlipidemia    Low cholesterol diet and exercise.  Follow lipid panel.        Relevant Medications   amLODipine (NORVASC) 10 MG tablet   losartan-hydrochlorothiazide (HYZAAR) 100-25 MG tablet   Other Relevant Orders   Lipid panel (Completed)     Other Visit Diagnoses    Prostate cancer screening       Relevant Orders   PSA (Completed)       Einar Pheasant, MD

## 2018-05-27 NOTE — Assessment & Plan Note (Signed)
Low carb diet and exercise.  He has adjusted his diet.  Has lost weight.  Up to date with eye checks.  Follow met b and a1c.  On metformin.

## 2018-05-28 ENCOUNTER — Encounter: Payer: Self-pay | Admitting: Internal Medicine

## 2018-05-28 ENCOUNTER — Other Ambulatory Visit: Payer: Self-pay | Admitting: Internal Medicine

## 2018-05-28 NOTE — Progress Notes (Signed)
Opened in error

## 2018-09-09 ENCOUNTER — Encounter: Payer: Self-pay | Admitting: Internal Medicine

## 2018-09-09 ENCOUNTER — Ambulatory Visit: Payer: BLUE CROSS/BLUE SHIELD | Admitting: Internal Medicine

## 2018-09-09 VITALS — BP 136/78 | HR 73 | Temp 97.8°F | Resp 18 | Wt 254.8 lb

## 2018-09-09 DIAGNOSIS — I10 Essential (primary) hypertension: Secondary | ICD-10-CM | POA: Diagnosis not present

## 2018-09-09 DIAGNOSIS — E119 Type 2 diabetes mellitus without complications: Secondary | ICD-10-CM

## 2018-09-09 DIAGNOSIS — E7800 Pure hypercholesterolemia, unspecified: Secondary | ICD-10-CM | POA: Diagnosis not present

## 2018-09-09 DIAGNOSIS — R945 Abnormal results of liver function studies: Secondary | ICD-10-CM | POA: Diagnosis not present

## 2018-09-09 DIAGNOSIS — R7989 Other specified abnormal findings of blood chemistry: Secondary | ICD-10-CM

## 2018-09-09 DIAGNOSIS — Z6834 Body mass index (BMI) 34.0-34.9, adult: Secondary | ICD-10-CM

## 2018-09-09 LAB — LIPID PANEL
CHOL/HDL RATIO: 5
Cholesterol: 211 mg/dL — ABNORMAL HIGH (ref 0–200)
HDL: 40.9 mg/dL (ref >39.00)
NONHDL: 170.39
Triglycerides: 205 mg/dL — ABNORMAL HIGH (ref 0.0–149.0)
VLDL: 41 mg/dL — ABNORMAL HIGH (ref 0.0–40.0)

## 2018-09-09 LAB — HEPATIC FUNCTION PANEL
ALT: 98 U/L — AB (ref 0–53)
AST: 48 U/L — AB (ref 0–37)
Albumin: 4.4 g/dL (ref 3.5–5.2)
Alkaline Phosphatase: 61 U/L (ref 39–117)
BILIRUBIN DIRECT: 0.1 mg/dL (ref 0.0–0.3)
BILIRUBIN TOTAL: 0.6 mg/dL (ref 0.2–1.2)
Total Protein: 7.3 g/dL (ref 6.0–8.3)

## 2018-09-09 LAB — BASIC METABOLIC PANEL
BUN: 14 mg/dL (ref 6–23)
CO2: 32 mEq/L (ref 19–32)
Calcium: 9.5 mg/dL (ref 8.4–10.5)
Chloride: 101 mEq/L (ref 96–112)
Creatinine, Ser: 1.04 mg/dL (ref 0.40–1.50)
GFR: 81.13 mL/min (ref >60.00)
Glucose, Bld: 129 mg/dL — ABNORMAL HIGH (ref 70–99)
POTASSIUM: 4.6 meq/L (ref 3.5–5.1)
SODIUM: 138 meq/L (ref 135–145)

## 2018-09-09 LAB — HEMOGLOBIN A1C: Hgb A1c MFr Bld: 6.3 % (ref 4.6–6.5)

## 2018-09-09 LAB — LDL CHOLESTEROL, DIRECT: LDL DIRECT: 148 mg/dL

## 2018-09-09 LAB — HM DIABETES FOOT EXAM

## 2018-09-09 NOTE — Progress Notes (Signed)
Patient ID: Michael Lane, male   DOB: 1971-07-14, 47 y.o.   MRN: [id number]   Subjective:    Patient ID: Michael Lane, male    DOB: 07-28-1971, 47 y.o.   MRN: [id number]  HPI  Patient here for a scheduled follow up.  Doing well.  Stays active.  Goes to the gym regularly.  No chest pain.  No sob.  No acid reflux.  No abdominal pain.  Bowels moving.  Trying to watch his diet.  Discussed low carb diet and exercise.  Overall feels good.  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  Feels good.     Past Medical History:  Diagnosis Date  . Hypertension    Past Surgical History:  Procedure Laterality Date  . VARICOCELE EXCISION     Family History  Problem Relation Age of Onset  . Arthritis Mother   . Cancer Mother        breast  . Breast cancer Mother   . Kidney disease Father        H/O kidney stones  . Hypertension Father    Social History   Socioeconomic History  . Marital status: Single    Spouse name: Not on file  . Number of children: Not on file  . Years of education: Not on file  . Highest education level: Not on file  Occupational History  . Not on file  Social Needs  . Financial resource strain: Not on file  . Food insecurity:    Worry: Not on file    Inability: Not on file  . Transportation needs:    Medical: Not on file    Non-medical: Not on file  Tobacco Use  . Smoking status: Former Smoker    Packs/day: 1.00    Years: 25.00    Pack years: 25.00    Types: Cigarettes  . Smokeless tobacco: Never Used  . Tobacco comment: Quit smoking in February 2015  Substance and Sexual Activity  . Alcohol use: No    Alcohol/week: 0.0 standard drinks  . Drug use: No    Types: Marijuana    Comment: past  . Sexual activity: Not on file  Lifestyle  . Physical activity:    Days per week: Not on file    Minutes per session: Not on file  . Stress: Not on file  Relationships  . Social connections:    Talks on  phone: Not on file    Gets together: Not on file    Attends religious service: Not on file    Active member of club or organization: Not on file    Attends meetings of clubs or organizations: Not on file    Relationship status: Not on file  Other Topics Concern  . Not on file  Social History Narrative  . Not on file    Outpatient Encounter Medications as of 09/09/2018  Medication Sig  . amLODipine (NORVASC) 10 MG tablet TAKE 1 TABLET BY MOUTH EVERY DAY  . Diclofenac Sodium (PENNSAID) 2 % SOLN Place 2 application onto the skin 2 (two) times daily.  . diclofenac sodium (VOLTAREN) 1 % GEL Apply 2 g topically 3 (three) times daily as needed.  Marland Kitchen losartan-hydrochlorothiazide (HYZAAR) 100-25 MG tablet Take 1 tablet by mouth daily.  . metFORMIN (GLUCOPHAGE) 500 MG tablet Take 1 tablet (500 mg total) by mouth 2 (two) times daily with a meal.   No facility-administered encounter medications  on file as of 09/09/2018.     Review of Systems  Constitutional: Negative for appetite change and unexpected weight change.  HENT: Negative for congestion and sinus pressure.   Respiratory: Negative for cough, chest tightness and shortness of breath.   Cardiovascular: Negative for chest pain, palpitations and leg swelling.  Gastrointestinal: Negative for abdominal pain, diarrhea, nausea and vomiting.  Genitourinary: Negative for difficulty urinating and dysuria.  Musculoskeletal: Negative for joint swelling and myalgias.  Skin: Negative for color change and rash.  Neurological: Negative for dizziness, light-headedness and headaches.  Psychiatric/Behavioral: Negative for agitation and dysphoric mood.       Objective:    Physical Exam  Constitutional: He appears well-developed and well-nourished. No distress.  HENT:  Nose: Nose normal.  Mouth/Throat: Oropharynx is clear and moist.  Neck: Neck supple. No thyromegaly present.  Cardiovascular: Normal rate and regular rhythm.  Pulmonary/Chest: Effort  normal and breath sounds normal. No respiratory distress.  Abdominal: Soft. Bowel sounds are normal. There is no tenderness.  Musculoskeletal: He exhibits no edema or tenderness.  Feet:  No lesions.  DP pulses palpable and equal bilaterally.  Sensation intact to light touch and pin prick.    Lymphadenopathy:    He has no cervical adenopathy.  Skin: No rash noted. No erythema.  Psychiatric: He has a normal mood and affect. His behavior is normal.    BP 136/78 (BP Location: Left Arm, Patient Position: Sitting, Cuff Size: Large)   Pulse 73   Temp 97.8 F (36.6 C) (Oral)   Resp 18   Wt 254 lb 12.8 oz (115.6 kg)   SpO2 98%   BMI 34.56 kg/m  Wt Readings from Last 3 Encounters:  09/09/18 254 lb 12.8 oz (115.6 kg)  05/27/18 251 lb (113.9 kg)  01/09/18 259 lb 12.8 oz (117.8 kg)     Lab Results  Component Value Date   WBC 5.7 05/27/2018   HGB 15.9 05/27/2018   HCT 46.5 05/27/2018   PLT 247.0 05/27/2018   GLUCOSE 129 (H) 09/09/2018   CHOL 211 (H) 09/09/2018   TRIG 205.0 (H) 09/09/2018   HDL 40.90 09/09/2018   LDLDIRECT 148.0 09/09/2018   LDLCALC 134 (H) 05/27/2018   ALT 98 (H) 09/09/2018   AST 48 (H) 09/09/2018   NA 138 09/09/2018   K 4.6 09/09/2018   CL 101 09/09/2018   CREATININE 1.04 09/09/2018   BUN 14 09/09/2018   CO2 32 09/09/2018   TSH 2.31 09/18/2017   PSA 0.79 05/27/2018   HGBA1C 6.3 09/09/2018   MICROALBUR 0.9 05/27/2018       Assessment & Plan:   Problem List Items Addressed This Visit    Abnormal liver function tests - Primary    Previously saw GI.  Has adjusted his diet.  Sugars have been better overall.  Follow liver function tests.        Relevant Orders   Hepatic function panel (Completed)   BMI 34.0-34.9,adult    Discussed diet and exercise.  Follow.        Diabetes mellitus ([place])    Low carb diet and exercise.  Follow met b and a1c.  Continue metformin.        Relevant Orders   Hemoglobin A1c (Completed)   Basic metabolic panel  (Completed)   Essential hypertension, benign    Blood pressure under good control.  Continue same medication regimen.  Follow pressures.  Follow metabolic panel.        Hyperlipidemia    Low  cholesterol diet and exercise.  Follow lipid panel.        Relevant Orders   Lipid panel (Completed)       Einar Pheasant, MD

## 2018-09-14 ENCOUNTER — Encounter: Payer: Self-pay | Admitting: Internal Medicine

## 2018-09-14 NOTE — Assessment & Plan Note (Signed)
Discussed diet and exercise.  Follow.  

## 2018-09-14 NOTE — Assessment & Plan Note (Signed)
Low cholesterol diet and exercise.  Follow lipid panel.   

## 2018-09-14 NOTE — Assessment & Plan Note (Signed)
Previously saw GI.  Has adjusted his diet.  Sugars have been better overall.  Follow liver function tests.

## 2018-09-14 NOTE — Assessment & Plan Note (Signed)
Blood pressure under good control.  Continue same medication regimen.  Follow pressures.  Follow metabolic panel.   

## 2018-09-14 NOTE — Assessment & Plan Note (Signed)
Low carb diet and exercise.  Follow met b and a1c.  Continue metformin.

## 2018-12-04 ENCOUNTER — Ambulatory Visit: Payer: Self-pay | Admitting: *Deleted

## 2018-12-04 NOTE — Telephone Encounter (Signed)
Michael Lane 1970/12/12 Patient called to inform doctor that he has had a slight fever and has been coughing up mucous for about a week. He did not want to make an appointment right now but wanted some advice as to what he should do. Tried to make an appointment but Dr. Nicki Reaper was not available for a while. Patient stated that Dr. Nicki Reaper told him to send a message if he needed an appt. And she would work him in. Please advise and call patient back at [phone]  Attempted to call patient regarding his cough/fever. Message sent to office- he feels that he can be worked into Dr [employer] schedule.

## 2018-12-04 NOTE — Telephone Encounter (Signed)
Per initial request dated 12/04/2018 at 1318, "Patient called to inform doctor that he has had a slight fever and has been coughing up mucous for about a week. He did not want to make an appointment right now but wanted some advice as to what he should do. Tried to make an appointment but Dr. Nicki Reaper was not available for a while. Patient stated that Dr. Nicki Reaper told him to send a message if he needed an appt. And she would work him in. Please advise and call patient back at [phone]";  attempted to contact pt regarding symptoms; left message on voicemail [phone].

## 2018-12-04 NOTE — Telephone Encounter (Signed)
Patient started fever yesterday- just got back from cruise. Patient has also started cough- with phlegm. Patient has not coughed anything up to see color yet. Patient just wants to make sure he doesn't need to come in- he did not get flu shot. Patient reports no sore throat, mild headache, body ache- positive. Told patient he may need to be checked out for flu with symptoms he is presenting. Patient wants to be seen today- he is going to check with [facility] and see if he can get in today- if he can't get in today- he will call back for appointment tomorrow.

## 2018-12-04 NOTE — Telephone Encounter (Signed)
Agree with need for evaluation.  

## 2018-12-04 NOTE — Telephone Encounter (Signed)
Sent to PCP as an FYI  

## 2018-12-05 NOTE — Telephone Encounter (Signed)
LMTCB to f/u with patient

## 2018-12-10 ENCOUNTER — Encounter: Payer: Self-pay | Admitting: Internal Medicine

## 2019-01-14 ENCOUNTER — Ambulatory Visit: Payer: BLUE CROSS/BLUE SHIELD | Admitting: Internal Medicine

## 2019-01-21 ENCOUNTER — Other Ambulatory Visit: Payer: Self-pay | Admitting: Internal Medicine

## 2019-01-21 ENCOUNTER — Other Ambulatory Visit: Payer: Self-pay

## 2019-01-21 ENCOUNTER — Encounter: Payer: Self-pay | Admitting: Internal Medicine

## 2019-01-21 ENCOUNTER — Ambulatory Visit (INDEPENDENT_AMBULATORY_CARE_PROVIDER_SITE_OTHER): Payer: BLUE CROSS/BLUE SHIELD | Admitting: Internal Medicine

## 2019-01-21 VITALS — BP 140/90 | HR 81 | Temp 98.2°F | Wt 265.4 lb

## 2019-01-21 DIAGNOSIS — E119 Type 2 diabetes mellitus without complications: Secondary | ICD-10-CM

## 2019-01-21 DIAGNOSIS — I10 Essential (primary) hypertension: Secondary | ICD-10-CM

## 2019-01-21 DIAGNOSIS — E871 Hypo-osmolality and hyponatremia: Secondary | ICD-10-CM

## 2019-01-21 DIAGNOSIS — E7800 Pure hypercholesterolemia, unspecified: Secondary | ICD-10-CM | POA: Diagnosis not present

## 2019-01-21 DIAGNOSIS — R945 Abnormal results of liver function studies: Secondary | ICD-10-CM | POA: Diagnosis not present

## 2019-01-21 DIAGNOSIS — Z6835 Body mass index (BMI) 35.0-35.9, adult: Secondary | ICD-10-CM

## 2019-01-21 DIAGNOSIS — R7989 Other specified abnormal findings of blood chemistry: Secondary | ICD-10-CM

## 2019-01-21 LAB — LIPID PANEL
Cholesterol: 210 mg/dL — ABNORMAL HIGH (ref 0–200)
HDL: 47.8 mg/dL (ref >39.00)
LDL Cholesterol: 137 mg/dL — ABNORMAL HIGH (ref 0–99)
NONHDL: 162.66
Total CHOL/HDL Ratio: 4
Triglycerides: 126 mg/dL (ref 0.0–149.0)
VLDL: 25.2 mg/dL (ref 0.0–40.0)

## 2019-01-21 LAB — BASIC METABOLIC PANEL
BUN: 16 mg/dL (ref 6–23)
CO2: 28 mEq/L (ref 19–32)
CREATININE: 0.88 mg/dL (ref 0.40–1.50)
Calcium: 9.3 mg/dL (ref 8.4–10.5)
Chloride: 101 mEq/L (ref 96–112)
GFR: 92.42 mL/min (ref >60.00)
Glucose, Bld: 169 mg/dL — ABNORMAL HIGH (ref 70–99)
Potassium: 4.2 mEq/L (ref 3.5–5.1)
Sodium: 136 mEq/L (ref 135–145)

## 2019-01-21 LAB — HEPATIC FUNCTION PANEL
ALT: 146 U/L — ABNORMAL HIGH (ref 0–53)
AST: 84 U/L — ABNORMAL HIGH (ref 0–37)
Albumin: 4.3 g/dL (ref 3.5–5.2)
Alkaline Phosphatase: 92 U/L (ref 39–117)
Bilirubin, Direct: 0.2 mg/dL (ref 0.0–0.3)
Total Bilirubin: 0.7 mg/dL (ref 0.2–1.2)
Total Protein: 7.2 g/dL (ref 6.0–8.3)

## 2019-01-21 LAB — HEMOGLOBIN A1C: Hgb A1c MFr Bld: 7.1 % — ABNORMAL HIGH (ref 4.6–6.5)

## 2019-01-21 MED ORDER — LOSARTAN POTASSIUM 50 MG PO TABS: 50.0000 mg | ORAL_TABLET | Freq: Every day | ORAL | 1 refills | Status: DC

## 2019-01-21 NOTE — Progress Notes (Signed)
Patient ID: Michael Lane, male   DOB: [birth year], 48 y.o.   MRN: [id number]   Subjective:    Patient ID: Michael Lane, male    DOB: Oct 25, 1971, 48 y.o.   MRN: [id number]  HPI  Patient here for a scheduled follow up.  He reports he is doing well.  Feels good. Staying active.  No chest pain.  No sob.  No acid reflux.  No abdominal pain.  Bowels moving.  No urine change.  Went on a cruise 11/2018.  Developed fever and aching after the cruise.  Lasted for several days and resolved.  No symptoms since.  Discussed low carb diet and exercise.     Past Medical History:  Diagnosis Date  . Hypertension    Past Surgical History:  Procedure Laterality Date  . VARICOCELE EXCISION     Family History  Problem Relation Age of Onset  . Arthritis Mother   . Cancer Mother        breast  . Breast cancer Mother   . Kidney disease Father        H/O kidney stones  . Hypertension Father    Social History   Socioeconomic History  . Marital status: Single    Spouse name: Not on file  . Number of children: Not on file  . Years of education: Not on file  . Highest education level: Not on file  Occupational History  . Not on file  Social Needs  . Financial resource strain: Not on file  . Food insecurity:    Worry: Not on file    Inability: Not on file  . Transportation needs:    Medical: Not on file    Non-medical: Not on file  Tobacco Use  . Smoking status: Former Smoker    Packs/day: 1.00    Years: 25.00    Pack years: 25.00    Types: Cigarettes  . Smokeless tobacco: Never Used  . Tobacco comment: Quit smoking in February 2015  Substance and Sexual Activity  . Alcohol use: No    Alcohol/week: 0.0 standard drinks  . Drug use: No    Types: Marijuana    Comment: past  . Sexual activity: Not on file  Lifestyle  . Physical activity:    Days per week: Not on file    Minutes per session: Not on file  . Stress: Not on file  Relationships  . Social connections:    Talks on phone: Not  on file    Gets together: Not on file    Attends religious service: Not on file    Active member of club or organization: Not on file    Attends meetings of clubs or organizations: Not on file    Relationship status: Not on file  Other Topics Concern  . Not on file  Social History Narrative  . Not on file    Outpatient Encounter Medications as of 01/21/2019  Medication Sig  . amLODipine (NORVASC) 10 MG tablet TAKE 1 TABLET BY MOUTH EVERY DAY  . Diclofenac Sodium (PENNSAID) 2 % SOLN Place 2 application onto the skin 2 (two) times daily.  . diclofenac sodium (VOLTAREN) 1 % GEL Apply 2 g topically 3 (three) times daily as needed.  . metFORMIN (GLUCOPHAGE) 500 MG tablet Take 1 tablet (500 mg total) by mouth 2 (two) times daily with a meal.  . losartan (COZAAR) 50 MG tablet Take 1 tablet (50 mg total) by mouth daily.  . [DISCONTINUED] losartan-hydrochlorothiazide (HYZAAR) 100-25  MG tablet Take 1 tablet by mouth daily. (Patient not taking: Reported on 01/21/2019)   No facility-administered encounter medications on file as of 01/21/2019.     Review of Systems  Constitutional: Negative for appetite change and unexpected weight change.  HENT: Negative for congestion and sinus pressure.   Respiratory: Negative for cough, chest tightness and shortness of breath.   Cardiovascular: Negative for chest pain, palpitations and leg swelling.  Gastrointestinal: Negative for abdominal pain, diarrhea, nausea and vomiting.  Genitourinary: Negative for difficulty urinating and dysuria.  Musculoskeletal: Negative for joint swelling and myalgias.  Skin: Negative for color change and rash.  Neurological: Negative for dizziness, light-headedness and headaches.  Psychiatric/Behavioral: Negative for agitation and dysphoric mood.       Objective:    Physical Exam Constitutional:      General: He is not in acute distress.    Appearance: Normal appearance. He is well-developed.  HENT:     Nose: Nose  normal. No congestion.     Mouth/Throat:     Pharynx: No oropharyngeal exudate or posterior oropharyngeal erythema.  Cardiovascular:     Rate and Rhythm: Normal rate and regular rhythm.  Pulmonary:     Effort: Pulmonary effort is normal. No respiratory distress.     Breath sounds: Normal breath sounds.  Abdominal:     General: Bowel sounds are normal.     Palpations: Abdomen is soft.     Tenderness: There is no abdominal tenderness.  Musculoskeletal:        General: No swelling or tenderness.  Skin:    Findings: No erythema or rash.  Neurological:     Mental Status: He is alert.  Psychiatric:        Mood and Affect: Mood normal.        Behavior: Behavior normal.     BP 140/90   Pulse 81   Temp 98.2 F (36.8 C) (Oral)   Wt 265 lb 6.4 oz (120.4 kg)   SpO2 96%   BMI 35.99 kg/m  Wt Readings from Last 3 Encounters:  01/21/19 265 lb 6.4 oz (120.4 kg)  09/09/18 254 lb 12.8 oz (115.6 kg)  05/27/18 251 lb (113.9 kg)     Lab Results  Component Value Date   WBC 5.7 05/27/2018   HGB 15.9 05/27/2018   HCT 46.5 05/27/2018   PLT 247.0 05/27/2018   GLUCOSE 169 (H) 01/21/2019   CHOL 210 (H) 01/21/2019   TRIG 126.0 01/21/2019   HDL 47.80 01/21/2019   LDLDIRECT 148.0 09/09/2018   LDLCALC 137 (H) 01/21/2019   ALT 146 (H) 01/21/2019   AST 84 (H) 01/21/2019   NA 136 01/21/2019   K 4.2 01/21/2019   CL 101 01/21/2019   CREATININE 0.88 01/21/2019   BUN 16 01/21/2019   CO2 28 01/21/2019   TSH 2.31 09/18/2017   PSA 0.79 05/27/2018   HGBA1C 7.1 (H) 01/21/2019   MICROALBUR 0.9 05/27/2018       Assessment & Plan:   Problem List Items Addressed This Visit    Abnormal liver function tests - Primary    Previously saw GI.  Discussed diet, exercise and weight loss.  Follow liver function tests.        BMI 35.0-35.9,adult    Discussed diet and exercise.  Follow.        Diabetes mellitus ([place])    Low carb diet and exercise.  Follow met b and a1c.        Relevant  Medications  losartan (COZAAR) 50 MG tablet   Other Relevant Orders   Hemoglobin A1c (Completed)   Basic metabolic panel (Completed)   Essential hypertension, benign    Blood pressure elevated.  Not taking losartan/hctz.  Start losartan 14m q day.  Follow pressures.  Follow metabolic panel.       Relevant Medications   losartan (COZAAR) 50 MG tablet   Hyperlipidemia    Low cholesterol diet and exercise.  Follow lipid panel.       Relevant Medications   losartan (COZAAR) 50 MG tablet   Other Relevant Orders   Hepatic function panel (Completed)   Lipid panel (Completed)       CEinar Pheasant MD

## 2019-01-21 NOTE — Progress Notes (Signed)
Order placed for f/u sodium check.   

## 2019-01-24 ENCOUNTER — Encounter: Payer: Self-pay | Admitting: Internal Medicine

## 2019-01-24 NOTE — Assessment & Plan Note (Signed)
Low cholesterol diet and exercise.  Follow lipid panel.   

## 2019-01-24 NOTE — Assessment & Plan Note (Signed)
Low carb diet and exercise.  Follow met b and a1c.   

## 2019-01-24 NOTE — Assessment & Plan Note (Signed)
Discussed diet and exercise.  Follow.  

## 2019-01-24 NOTE — Assessment & Plan Note (Signed)
Previously saw GI.  Discussed diet, exercise and weight loss.  Follow liver function tests.

## 2019-01-24 NOTE — Assessment & Plan Note (Signed)
Blood pressure elevated.  Not taking losartan/hctz.  Start losartan 50mg  q day.  Follow pressures.  Follow metabolic panel.

## 2019-03-17 ENCOUNTER — Ambulatory Visit: Payer: BLUE CROSS/BLUE SHIELD | Admitting: Internal Medicine

## 2019-05-26 ENCOUNTER — Other Ambulatory Visit: Payer: Self-pay

## 2019-05-29 ENCOUNTER — Other Ambulatory Visit: Payer: Self-pay

## 2019-05-29 ENCOUNTER — Encounter: Payer: Self-pay | Admitting: Internal Medicine

## 2019-05-29 ENCOUNTER — Ambulatory Visit (INDEPENDENT_AMBULATORY_CARE_PROVIDER_SITE_OTHER): Payer: BLUE CROSS/BLUE SHIELD | Admitting: Internal Medicine

## 2019-05-29 VITALS — BP 136/80 | HR 75 | Temp 98.4°F | Resp 16 | Ht 72.0 in | Wt 255.0 lb

## 2019-05-29 DIAGNOSIS — R945 Abnormal results of liver function studies: Secondary | ICD-10-CM | POA: Diagnosis not present

## 2019-05-29 DIAGNOSIS — E7800 Pure hypercholesterolemia, unspecified: Secondary | ICD-10-CM

## 2019-05-29 DIAGNOSIS — Z125 Encounter for screening for malignant neoplasm of prostate: Secondary | ICD-10-CM | POA: Diagnosis not present

## 2019-05-29 DIAGNOSIS — R7989 Other specified abnormal findings of blood chemistry: Secondary | ICD-10-CM

## 2019-05-29 DIAGNOSIS — I10 Essential (primary) hypertension: Secondary | ICD-10-CM

## 2019-05-29 DIAGNOSIS — Z0000 Encounter for general adult medical examination without abnormal findings: Secondary | ICD-10-CM | POA: Diagnosis not present

## 2019-05-29 DIAGNOSIS — E119 Type 2 diabetes mellitus without complications: Secondary | ICD-10-CM

## 2019-05-29 DIAGNOSIS — R6882 Decreased libido: Secondary | ICD-10-CM | POA: Insufficient documentation

## 2019-05-29 LAB — BASIC METABOLIC PANEL
BUN: 11 mg/dL (ref 6–23)
CO2: 30 mEq/L (ref 19–32)
Calcium: 9.2 mg/dL (ref 8.4–10.5)
Chloride: 105 mEq/L (ref 96–112)
Creatinine, Ser: 0.83 mg/dL (ref 0.40–1.50)
GFR: 98.72 mL/min (ref >60.00)
Glucose, Bld: 123 mg/dL — ABNORMAL HIGH (ref 70–99)
Potassium: 4.8 mEq/L (ref 3.5–5.1)
Sodium: 140 mEq/L (ref 135–145)

## 2019-05-29 LAB — CBC WITH DIFFERENTIAL/PLATELET
Basophils Absolute: 0 10*3/uL (ref 0.0–0.1)
Basophils Relative: 0.7 % (ref 0.0–3.0)
Eosinophils Absolute: 0.1 10*3/uL (ref 0.0–0.7)
Eosinophils Relative: 1.9 % (ref 0.0–5.0)
HCT: 48.1 % (ref 39.0–52.0)
Hemoglobin: 16.1 g/dL (ref 13.0–17.0)
Lymphocytes Relative: 46.5 % — ABNORMAL HIGH (ref 12.0–46.0)
Lymphs Abs: 3.1 10*3/uL (ref 0.7–4.0)
MCHC: 33.5 g/dL (ref 30.0–36.0)
MCV: 95.8 fl (ref 78.0–100.0)
Monocytes Absolute: 0.4 10*3/uL (ref 0.1–1.0)
Monocytes Relative: 5.8 % (ref 3.0–12.0)
Neutro Abs: 3 10*3/uL (ref 1.4–7.7)
Neutrophils Relative %: 45.1 % (ref 43.0–77.0)
Platelets: 248 10*3/uL (ref 150.0–400.0)
RBC: 5.03 Mil/uL (ref 4.22–5.81)
RDW: 13.9 % (ref 11.5–15.5)
WBC: 6.7 10*3/uL (ref 4.0–10.5)

## 2019-05-29 LAB — HEPATIC FUNCTION PANEL
ALT: 126 U/L — ABNORMAL HIGH (ref 0–53)
AST: 65 U/L — ABNORMAL HIGH (ref 0–37)
Albumin: 4.3 g/dL (ref 3.5–5.2)
Alkaline Phosphatase: 89 U/L (ref 39–117)
Bilirubin, Direct: 0.2 mg/dL (ref 0.0–0.3)
Total Bilirubin: 0.7 mg/dL (ref 0.2–1.2)
Total Protein: 6.5 g/dL (ref 6.0–8.3)

## 2019-05-29 LAB — LIPID PANEL
Cholesterol: 196 mg/dL (ref 0–200)
HDL: 39.7 mg/dL (ref >39.00)
LDL Cholesterol: 135 mg/dL — ABNORMAL HIGH (ref 0–99)
NonHDL: 155.83
Total CHOL/HDL Ratio: 5
Triglycerides: 105 mg/dL (ref 0.0–149.0)
VLDL: 21 mg/dL (ref 0.0–40.0)

## 2019-05-29 LAB — PSA: PSA: 0.8 ng/mL (ref 0.10–4.00)

## 2019-05-29 LAB — MICROALBUMIN / CREATININE URINE RATIO
Creatinine,U: 119.2 mg/dL
Microalb Creat Ratio: 0.6 mg/g (ref 0.0–30.0)
Microalb, Ur: <0.7 mg/dL (ref 0.0–1.9)

## 2019-05-29 LAB — HEMOGLOBIN A1C: Hgb A1c MFr Bld: 6.5 % (ref 4.6–6.5)

## 2019-05-29 LAB — TSH: TSH: 1.32 u[IU]/mL (ref 0.35–4.50)

## 2019-05-29 NOTE — Assessment & Plan Note (Signed)
Low cholesterol diet and exercise.  Follow lipid panel.   

## 2019-05-29 NOTE — Assessment & Plan Note (Addendum)
Has been evaluated by GI.  Have discussed diet, exercise and weight loss.  Has attended [facility].  Keep sugars under control.  Follow liver function tests.  States had hepatitis series when worked at a [employer].

## 2019-05-29 NOTE — Assessment & Plan Note (Signed)
Physical today 05/29/19.  Check psa.

## 2019-05-29 NOTE — Assessment & Plan Note (Signed)
Low carb diet and exercise.  Follow met b and a1c.  Continue metformin.

## 2019-05-29 NOTE — Progress Notes (Signed)
Patient ID: Laurence Ferrari, male   DOB: 09-14-1971, 48 y.o.   MRN: [id number]   Subjective:    Patient ID: Laurence Ferrari, male    DOB: 07/22/1971, 48 y.o.   MRN: [id number]  HPI  Patient here for his physical exam.  States he is doing relatively well.  Has started working out on a regular basis again.  Gyms were closed due to [place]. Does watch what he eats.  Has lost weight.  No chest pain.  No sob.  No acid reflux.  No abdominal pain.  Bowels moving.  No urine change.  Does report decreased libido.  Wants testosterone level checked.  No problem obtaining or sustaining an erection.  Not checking sugars or blood pressures.  Business doing well.      Past Medical History:  Diagnosis Date  . Hypertension    Past Surgical History:  Procedure Laterality Date  . VARICOCELE EXCISION     Family History  Problem Relation Age of Onset  . Arthritis Mother   . Cancer Mother        breast  . Breast cancer Mother   . Kidney disease Father        H/O kidney stones  . Hypertension Father    Social History   Socioeconomic History  . Marital status: Single    Spouse name: Not on file  . Number of children: Not on file  . Years of education: Not on file  . Highest education level: Not on file  Occupational History  . Not on file  Social Needs  . Financial resource strain: Not on file  . Food insecurity    Worry: Not on file    Inability: Not on file  . Transportation needs    Medical: Not on file    Non-medical: Not on file  Tobacco Use  . Smoking status: Former Smoker    Packs/day: 1.00    Years: 25.00    Pack years: 25.00    Types: Cigarettes  . Smokeless tobacco: Never Used  . Tobacco comment: Quit smoking in February 2015  Substance and Sexual Activity  . Alcohol use: No    Alcohol/week: 0.0 standard drinks  . Drug use: No    Types: Marijuana    Comment: past  . Sexual activity: Not on file  Lifestyle  . Physical activity    Days per week: Not on file    Minutes per  session: Not on file  . Stress: Not on file  Relationships  . Social Herbalist on phone: Not on file    Gets together: Not on file    Attends religious service: Not on file    Active member of club or organization: Not on file    Attends meetings of clubs or organizations: Not on file    Relationship status: Not on file  Other Topics Concern  . Not on file  Social History Narrative  . Not on file    Outpatient Encounter Medications as of 05/29/2019  Medication Sig  . amLODipine (NORVASC) 10 MG tablet TAKE 1 TABLET BY MOUTH EVERY DAY  . Diclofenac Sodium (PENNSAID) 2 % SOLN Place 2 application onto the skin 2 (two) times daily.  . diclofenac sodium (VOLTAREN) 1 % GEL Apply 2 g topically 3 (three) times daily as needed.  Marland Kitchen losartan (COZAAR) 50 MG tablet Take 1 tablet (50 mg total) by mouth daily.  . metFORMIN (GLUCOPHAGE) 500 MG tablet Take 1 tablet (  500 mg total) by mouth 2 (two) times daily with a meal.   No facility-administered encounter medications on file as of 05/29/2019.     Review of Systems  Constitutional: Negative for appetite change and unexpected weight change.  HENT: Negative for congestion and sinus pressure.   Eyes: Negative for pain and visual disturbance.  Respiratory: Negative for cough, chest tightness and shortness of breath.   Cardiovascular: Negative for chest pain, palpitations and leg swelling.  Gastrointestinal: Negative for abdominal pain, diarrhea, nausea and vomiting.  Genitourinary: Negative for difficulty urinating and dysuria.       Does report decreased libido.  No problems obtaining or sustaining an erection.    Musculoskeletal: Negative for joint swelling and myalgias.  Skin: Negative for color change and rash.  Neurological: Negative for dizziness, light-headedness and headaches.  Hematological: Negative for adenopathy. Does not bruise/bleed easily.  Psychiatric/Behavioral: Negative for agitation and dysphoric mood.        Objective:    Physical Exam Constitutional:      General: He is not in acute distress.    Appearance: Normal appearance. He is well-developed.  HENT:     Head: Normocephalic and atraumatic.     Right Ear: External ear normal. There is no impacted cerumen.     Left Ear: External ear normal. There is no impacted cerumen.  Eyes:     General:        Right eye: No discharge.        Left eye: No discharge.     Conjunctiva/sclera: Conjunctivae normal.  Neck:     Musculoskeletal: Neck supple. No muscular tenderness.     Thyroid: No thyromegaly.  Cardiovascular:     Rate and Rhythm: Normal rate and regular rhythm.  Pulmonary:     Effort: No respiratory distress.     Breath sounds: Normal breath sounds. No wheezing.  Abdominal:     General: Bowel sounds are normal.     Palpations: Abdomen is soft.     Tenderness: There is no abdominal tenderness.  Genitourinary:    Comments: Not performed Musculoskeletal:        General: No swelling or tenderness.  Lymphadenopathy:     Cervical: No cervical adenopathy.  Skin:    Findings: No erythema or rash.  Neurological:     Mental Status: He is alert and oriented to person, place, and time.  Psychiatric:        Mood and Affect: Mood normal.        Behavior: Behavior normal.     BP 136/80   Pulse 75   Temp 98.4 F (36.9 C) (Oral)   Resp 16   Ht 6' (1.829 m)   Wt 255 lb (115.7 kg)   SpO2 97%   BMI 34.58 kg/m  Wt Readings from Last 3 Encounters:  05/29/19 255 lb (115.7 kg)  01/21/19 265 lb 6.4 oz (120.4 kg)  09/09/18 254 lb 12.8 oz (115.6 kg)     Lab Results  Component Value Date   WBC 6.7 05/29/2019   HGB 16.1 05/29/2019   HCT 48.1 05/29/2019   PLT 248.0 05/29/2019   GLUCOSE 123 (H) 05/29/2019   CHOL 196 05/29/2019   TRIG 105.0 05/29/2019   HDL 39.70 05/29/2019   LDLDIRECT 148.0 09/09/2018   LDLCALC 135 (H) 05/29/2019   ALT 126 (H) 05/29/2019   AST 65 (H) 05/29/2019   NA 140 05/29/2019   K 4.8 05/29/2019   CL 105  05/29/2019   CREATININE 0.83  05/29/2019   BUN 11 05/29/2019   CO2 30 05/29/2019   TSH 1.32 05/29/2019   PSA 0.80 05/29/2019   HGBA1C 6.5 05/29/2019   MICROALBUR <0.7 05/29/2019       Assessment & Plan:   Problem List Items Addressed This Visit    Abnormal liver function tests    Has been evaluated by GI.  Have discussed diet, exercise and weight loss.  Has attended [facility].  Keep sugars under control.  Follow liver function tests.  States had hepatitis series when worked at a [employer], [employer].        Decreased libido    Discussed with him today.  No problems obtaining or sustaining an erection.  Check total and free testosterone levels.        Relevant Orders   Testosterone , Free and Total   Diabetes mellitus ([place])    Low carb diet and exercise.  Follow met b and a1c.  Continue metformin.        Relevant Orders   Hemoglobin A1c (Completed)   Basic metabolic panel (Completed)   Microalbumin / creatinine urine ratio (Completed)   Essential hypertension, benign - Primary   Relevant Orders   CBC with Differential/Platelet (Completed)   TSH (Completed)   Health care maintenance    Physical today 05/29/19.  Check psa.       Hyperlipidemia    Low cholesterol diet and exercise.  Follow lipid panel.       Relevant Orders   Hepatic function panel (Completed)   Lipid panel (Completed)    Other Visit Diagnoses    Prostate cancer screening       Relevant Orders   PSA (Completed)       Einar Pheasant, MD

## 2019-05-30 ENCOUNTER — Encounter: Payer: Self-pay | Admitting: Internal Medicine

## 2019-05-30 NOTE — Assessment & Plan Note (Signed)
Discussed with him today.  No problems obtaining or sustaining an erection.  Check total and free testosterone levels.

## 2019-06-01 ENCOUNTER — Encounter: Payer: Self-pay | Admitting: Internal Medicine

## 2019-06-02 LAB — TESTOSTERONE, FREE & TOTAL
Free Testosterone: 35.5 pg/mL (ref 35.0–155.0)
Testosterone, Total, LC-MS-MS: 332 ng/dL (ref 250–1100)

## 2019-06-03 ENCOUNTER — Encounter: Payer: Self-pay | Admitting: Internal Medicine

## 2019-07-11 ENCOUNTER — Other Ambulatory Visit: Payer: Self-pay | Admitting: Internal Medicine

## 2019-07-17 ENCOUNTER — Other Ambulatory Visit: Payer: Self-pay | Admitting: Internal Medicine

## 2019-08-12 ENCOUNTER — Other Ambulatory Visit: Payer: Self-pay | Admitting: Internal Medicine

## 2019-09-15 ENCOUNTER — Telehealth: Payer: Self-pay | Admitting: *Deleted

## 2019-09-15 NOTE — Telephone Encounter (Signed)
Copied from [place] [id number]. Topic: General - Inquiry >> Sep 15, 2019  3:59 PM Richardo Priest, [place] wrote: Reason for CRM: Pt called in stating he would like to see if anything can be done in regards to recent bill for $523.00 for physical. 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 Pt does have [employer]. Pt is wondering if any way to reduce bill as billing is not working with him. Please advise.

## 2019-09-15 NOTE — Telephone Encounter (Signed)
Is this something you can help with?

## 2019-09-15 NOTE — Telephone Encounter (Signed)
We can't do anything about this we are not credentialed with [facility].

## 2019-09-18 NOTE — Telephone Encounter (Signed)
Patient is aware. Gave verbal understanding.

## 2019-09-30 ENCOUNTER — Ambulatory Visit: Payer: BLUE CROSS/BLUE SHIELD | Admitting: Internal Medicine

## 2020-02-22 ENCOUNTER — Ambulatory Visit: Payer: 59 | Admitting: Internal Medicine

## 2020-02-22 ENCOUNTER — Other Ambulatory Visit: Payer: Self-pay

## 2020-02-22 DIAGNOSIS — R945 Abnormal results of liver function studies: Secondary | ICD-10-CM

## 2020-02-22 DIAGNOSIS — E7800 Pure hypercholesterolemia, unspecified: Secondary | ICD-10-CM | POA: Diagnosis not present

## 2020-02-22 DIAGNOSIS — R7989 Other specified abnormal findings of blood chemistry: Secondary | ICD-10-CM

## 2020-02-22 DIAGNOSIS — I10 Essential (primary) hypertension: Secondary | ICD-10-CM | POA: Diagnosis not present

## 2020-02-22 DIAGNOSIS — F439 Reaction to severe stress, unspecified: Secondary | ICD-10-CM

## 2020-02-22 DIAGNOSIS — E119 Type 2 diabetes mellitus without complications: Secondary | ICD-10-CM

## 2020-02-22 NOTE — Progress Notes (Signed)
Patient ID: Michael Lane, male   DOB: 1970/11/26, 49 y.o.   MRN: [id number]   Subjective:    Patient ID: Michael Lane, male    DOB: 1971/10/01, 49 y.o.   MRN: [id number]  HPI This visit occurred during the SARS-CoV-2 public health emergency.  Safety protocols were in place, including screening questions prior to the visit, additional usage of staff PPE, and extensive cleaning of exam room while observing appropriate contact time as indicated for disinfecting solutions.  Patient here as a work in appt.  Increased stress recently.  Engaged Christmas - separated New Years.  Persistent stress.  Lonely.  Started working out after the separation.  Exercising - 30 minutes cardio every day - exercising.  Has adjusted diet.  Has lost weight.  No chest pain or sob reported.  No abdominal pain.  Bowels moving.    Past Medical History:  Diagnosis Date  . Hypertension    Past Surgical History:  Procedure Laterality Date  . VARICOCELE EXCISION     Family History  Problem Relation Age of Onset  . Arthritis Mother   . Cancer Mother        breast  . Breast cancer Mother   . Kidney disease Father        H/O kidney stones  . Hypertension Father    Social History   Socioeconomic History  . Marital status: Single    Spouse name: Not on file  . Number of children: Not on file  . Years of education: Not on file  . Highest education level: Not on file  Occupational History  . Not on file  Tobacco Use  . Smoking status: Former Smoker    Packs/day: 1.00    Years: 25.00    Pack years: 25.00    Types: Cigarettes  . Smokeless tobacco: Never Used  . Tobacco comment: Quit smoking in February 2015  Substance and Sexual Activity  . Alcohol use: No    Alcohol/week: 0.0 standard drinks  . Drug use: No    Types: Marijuana    Comment: past  . Sexual activity: Not on file  Other Topics Concern  . Not on file  Social History Narrative  . Not on file   Social Determinants of Health   Financial  Resource Strain:   . Difficulty of Paying Living Expenses:   Food Insecurity:   . Worried About Charity fundraiser in the Last Year:   . Arboriculturist in the Last Year:   Transportation Needs:   . Film/video editor (Medical):   Marland Kitchen Lack of Transportation (Non-Medical):   Physical Activity:   . Days of Exercise per Week:   . Minutes of Exercise per Session:   Stress:   . Feeling of Stress :   Social Connections:   . Frequency of Communication with Friends and Family:   . Frequency of Social Gatherings with Friends and Family:   . Attends Religious Services:   . Active Member of Clubs or Organizations:   . Attends Archivist Meetings:   Marland Kitchen Marital Status:     Outpatient Encounter Medications as of 02/22/2020  Medication Sig  . amLODipine (NORVASC) 10 MG tablet TAKE 1 TABLET BY MOUTH EVERY DAY  . diclofenac sodium (VOLTAREN) 1 % GEL Apply 2 g topically 3 (three) times daily as needed.  Marland Kitchen losartan (COZAAR) 25 MG tablet TAKE **2** TABLETS BY MOUTH EVERY DAY (50 MG ON BACKORDER)  . [DISCONTINUED] Diclofenac Sodium (  PENNSAID) 2 % SOLN Place 2 application onto the skin 2 (two) times daily.  . [DISCONTINUED] metFORMIN (GLUCOPHAGE) 500 MG tablet TAKE 1 TABLET (500 MG TOTAL) BY MOUTH 2 (TWO) TIMES DAILY WITH A MEAL.   No facility-administered encounter medications on file as of 02/22/2020.   Review of Systems  Constitutional:       Has adjusted diet.  Lost weight.    HENT: Negative for congestion and sinus pressure.   Respiratory: Negative for cough, chest tightness and shortness of breath.   Cardiovascular: Negative for chest pain, palpitations and leg swelling.  Gastrointestinal: Negative for abdominal pain, diarrhea, nausea and vomiting.  Genitourinary: Negative for difficulty urinating and dysuria.  Musculoskeletal: Negative for joint swelling and myalgias.  Skin: Negative for color change and rash.  Neurological: Negative for dizziness, light-headedness and headaches.   Psychiatric/Behavioral: Negative for agitation.       Increased stress as outlined.         Objective:    Physical Exam Constitutional:      General: He is not in acute distress.    Appearance: Normal appearance. He is well-developed.  HENT:     Head: Normocephalic and atraumatic.     Right Ear: External ear normal.     Left Ear: External ear normal.  Eyes:     General: No scleral icterus.       Right eye: No discharge.        Left eye: No discharge.     Conjunctiva/sclera: Conjunctivae normal.  Cardiovascular:     Rate and Rhythm: Normal rate and regular rhythm.  Pulmonary:     Effort: Pulmonary effort is normal. No respiratory distress.     Breath sounds: Normal breath sounds.  Abdominal:     General: Bowel sounds are normal.     Palpations: Abdomen is soft.     Tenderness: There is no abdominal tenderness.  Musculoskeletal:        General: No swelling or tenderness.     Cervical back: Neck supple. No tenderness.  Lymphadenopathy:     Cervical: No cervical adenopathy.  Skin:    Findings: No erythema or rash.  Neurological:     Mental Status: He is alert.  Psychiatric:        Mood and Affect: Mood normal.        Behavior: Behavior normal.     BP 140/80   Pulse 80   Temp 98.2 F (36.8 C)   Resp 16   Ht 6' (1.829 m)   Wt 213 lb 6.4 oz (96.8 kg)   SpO2 97%   BMI 28.94 kg/m  Wt Readings from Last 3 Encounters:  02/22/20 213 lb 6.4 oz (96.8 kg)  05/29/19 255 lb (115.7 kg)  01/21/19 265 lb 6.4 oz (120.4 kg)     Lab Results  Component Value Date   WBC 7.5 02/24/2020   HGB 16.7 02/24/2020   HCT 49.6 02/24/2020   PLT 256.0 02/24/2020   GLUCOSE 106 (H) 02/24/2020   CHOL 174 02/24/2020   TRIG 63.0 02/24/2020   HDL 47.30 02/24/2020   LDLDIRECT 148.0 09/09/2018   LDLCALC 114 (H) 02/24/2020   ALT 32 02/24/2020   AST 26 02/24/2020   NA 138 02/24/2020   K 4.6 02/24/2020   CL 102 02/24/2020   CREATININE 0.97 02/24/2020   BUN 15 02/24/2020   CO2 32  02/24/2020   TSH 1.26 02/24/2020   PSA 0.80 05/29/2019   HGBA1C 5.7 02/24/2020   MICROALBUR <  0.7 05/29/2019       Assessment & Plan:   Problem List Items Addressed This Visit    Abnormal liver function tests    Has been evaluated by GI.  Has adjusted diet and is exercising.  Lost weight.  Check liver panel.       Diabetes mellitus ([place])    Has adjusted diet. Lost a significant amount of weight.  Check met b and a1c.       Essential hypertension, benign    Taking amlodipine and losartan.  Blood pressure has been under good control.  Follow pressures.  Follow metabolic panel.       Hyperlipidemia    Has adjusted his diet.  Exercising.  Check lipid panel.       Stress    Increased stress as outlined.  Discussed with him today.  Does not feel needs counseling, etc. Desires to have testosterone checked.  Will check routine labs and testosterone level.  Follow.            Einar Pheasant, MD

## 2020-02-23 ENCOUNTER — Telehealth: Payer: Self-pay

## 2020-02-23 DIAGNOSIS — R5383 Other fatigue: Secondary | ICD-10-CM

## 2020-02-23 DIAGNOSIS — R945 Abnormal results of liver function studies: Secondary | ICD-10-CM

## 2020-02-23 DIAGNOSIS — E119 Type 2 diabetes mellitus without complications: Secondary | ICD-10-CM

## 2020-02-23 DIAGNOSIS — R7989 Other specified abnormal findings of blood chemistry: Secondary | ICD-10-CM

## 2020-02-23 DIAGNOSIS — I10 Essential (primary) hypertension: Secondary | ICD-10-CM

## 2020-02-23 DIAGNOSIS — E7800 Pure hypercholesterolemia, unspecified: Secondary | ICD-10-CM

## 2020-02-23 NOTE — Addendum Note (Signed)
Addended by: Alisa Graff on: 02/23/2020 10:24 PM   Modules accepted: Orders

## 2020-02-23 NOTE — Telephone Encounter (Signed)
Orders placed for labs

## 2020-02-23 NOTE — Telephone Encounter (Signed)
Please place future orders. Thank you.

## 2020-02-24 ENCOUNTER — Other Ambulatory Visit (INDEPENDENT_AMBULATORY_CARE_PROVIDER_SITE_OTHER): Payer: 59

## 2020-02-24 ENCOUNTER — Other Ambulatory Visit: Payer: Self-pay

## 2020-02-24 DIAGNOSIS — I10 Essential (primary) hypertension: Secondary | ICD-10-CM | POA: Diagnosis not present

## 2020-02-24 DIAGNOSIS — E7800 Pure hypercholesterolemia, unspecified: Secondary | ICD-10-CM | POA: Diagnosis not present

## 2020-02-24 DIAGNOSIS — R7989 Other specified abnormal findings of blood chemistry: Secondary | ICD-10-CM

## 2020-02-24 DIAGNOSIS — E119 Type 2 diabetes mellitus without complications: Secondary | ICD-10-CM | POA: Diagnosis not present

## 2020-02-24 DIAGNOSIS — R945 Abnormal results of liver function studies: Secondary | ICD-10-CM | POA: Diagnosis not present

## 2020-02-24 DIAGNOSIS — R5383 Other fatigue: Secondary | ICD-10-CM | POA: Diagnosis not present

## 2020-02-24 LAB — BASIC METABOLIC PANEL
BUN: 15 mg/dL (ref 6–23)
CO2: 32 mEq/L (ref 19–32)
Calcium: 9.1 mg/dL (ref 8.4–10.5)
Chloride: 102 mEq/L (ref 96–112)
Creatinine, Ser: 0.97 mg/dL (ref 0.40–1.50)
GFR: 82.22 mL/min (ref >60.00)
Glucose, Bld: 106 mg/dL — ABNORMAL HIGH (ref 70–99)
Potassium: 4.6 mEq/L (ref 3.5–5.1)
Sodium: 138 mEq/L (ref 135–145)

## 2020-02-24 LAB — HEPATIC FUNCTION PANEL
ALT: 32 U/L (ref 0–53)
AST: 26 U/L (ref 0–37)
Albumin: 4.1 g/dL (ref 3.5–5.2)
Alkaline Phosphatase: 69 U/L (ref 39–117)
Bilirubin, Direct: 0.2 mg/dL (ref 0.0–0.3)
Total Bilirubin: 0.8 mg/dL (ref 0.2–1.2)
Total Protein: 6.6 g/dL (ref 6.0–8.3)

## 2020-02-24 LAB — LIPID PANEL
Cholesterol: 174 mg/dL (ref 0–200)
HDL: 47.3 mg/dL (ref >39.00)
LDL Cholesterol: 114 mg/dL — ABNORMAL HIGH (ref 0–99)
NonHDL: 126.35
Total CHOL/HDL Ratio: 4
Triglycerides: 63 mg/dL (ref 0.0–149.0)
VLDL: 12.6 mg/dL (ref 0.0–40.0)

## 2020-02-24 LAB — TSH: TSH: 1.26 u[IU]/mL (ref 0.35–4.50)

## 2020-02-24 LAB — HEMOGLOBIN A1C: Hgb A1c MFr Bld: 5.7 % (ref 4.6–6.5)

## 2020-02-25 ENCOUNTER — Telehealth: Payer: Self-pay | Admitting: Internal Medicine

## 2020-02-25 LAB — CBC WITH DIFFERENTIAL/PLATELET
Basophils Absolute: 0.1 10*3/uL (ref 0.0–0.1)
Basophils Relative: 1.2 % (ref 0.0–3.0)
Eosinophils Absolute: 0.1 10*3/uL (ref 0.0–0.7)
Eosinophils Relative: 1.1 % (ref 0.0–5.0)
HCT: 49.6 % (ref 39.0–52.0)
Hemoglobin: 16.7 g/dL (ref 13.0–17.0)
Lymphocytes Relative: 33.8 % (ref 12.0–46.0)
Lymphs Abs: 2.5 10*3/uL (ref 0.7–4.0)
MCHC: 33.6 g/dL (ref 30.0–36.0)
MCV: 95.9 fl (ref 78.0–100.0)
Monocytes Absolute: 0.4 10*3/uL (ref 0.1–1.0)
Monocytes Relative: 5.5 % (ref 3.0–12.0)
Neutro Abs: 4.4 10*3/uL (ref 1.4–7.7)
Neutrophils Relative %: 58.4 % (ref 43.0–77.0)
Platelets: 256 10*3/uL (ref 150.0–400.0)
RBC: 5.18 Mil/uL (ref 4.22–5.81)
RDW: 14.1 % (ref 11.5–15.5)
WBC: 7.5 10*3/uL (ref 4.0–10.5)

## 2020-02-25 NOTE — Telephone Encounter (Signed)
Pt called about lab results. °

## 2020-02-26 ENCOUNTER — Encounter: Payer: Self-pay | Admitting: Internal Medicine

## 2020-02-26 NOTE — Telephone Encounter (Signed)
Called patient to let him know that testosterone not yet resulted and will call once result comes back. Pt gave verbal understanding

## 2020-02-27 LAB — TESTOSTERONE, FREE & TOTAL
Free Testosterone: 149.8 pg/mL (ref 35.0–155.0)
Testosterone, Total, LC-MS-MS: 1007 ng/dL (ref 250–1100)

## 2020-02-28 ENCOUNTER — Encounter: Payer: Self-pay | Admitting: Internal Medicine

## 2020-02-28 NOTE — Assessment & Plan Note (Signed)
Has adjusted his diet.  Exercising.  Check lipid panel.

## 2020-02-28 NOTE — Assessment & Plan Note (Signed)
Has been evaluated by GI.  Has adjusted diet and is exercising.  Lost weight.  Check liver panel.

## 2020-02-28 NOTE — Assessment & Plan Note (Signed)
Has adjusted diet. Lost a significant amount of weight.  Check met b and a1c.

## 2020-02-28 NOTE — Assessment & Plan Note (Signed)
Increased stress as outlined.  Discussed with him today.  Does not feel needs counseling, etc. Desires to have testosterone checked.  Will check routine labs and testosterone level.  Follow.

## 2020-02-28 NOTE — Assessment & Plan Note (Addendum)
Taking amlodipine and losartan.  Blood pressure has been under good control.  Follow pressures.  Follow metabolic panel.

## 2020-02-29 ENCOUNTER — Encounter: Payer: Self-pay | Admitting: Internal Medicine

## 2020-02-29 NOTE — Telephone Encounter (Signed)
Mailed to patient

## 2020-02-29 NOTE — Telephone Encounter (Signed)
I sent pt a message regarding his labs - testosterone labs.  He sent a my chart back asking for numbers.  Please send copy to him.  Thanks.

## 2020-03-12 ENCOUNTER — Other Ambulatory Visit: Payer: Self-pay | Admitting: Internal Medicine

## 2020-05-31 ENCOUNTER — Encounter: Payer: 59 | Admitting: Internal Medicine

## 2020-08-21 ENCOUNTER — Other Ambulatory Visit: Payer: Self-pay | Admitting: Internal Medicine

## 2020-09-04 ENCOUNTER — Other Ambulatory Visit: Payer: Self-pay | Admitting: Internal Medicine

## 2021-04-25 ENCOUNTER — Encounter: Payer: 59 | Admitting: Internal Medicine

## 2021-06-22 ENCOUNTER — Ambulatory Visit: Payer: 59 | Admitting: Dermatology

## 2021-06-22 ENCOUNTER — Encounter: Payer: Self-pay | Admitting: Dermatology

## 2021-06-22 ENCOUNTER — Other Ambulatory Visit: Payer: Self-pay

## 2021-06-22 DIAGNOSIS — D225 Melanocytic nevi of trunk: Secondary | ICD-10-CM

## 2021-06-22 DIAGNOSIS — L578 Other skin changes due to chronic exposure to nonionizing radiation: Secondary | ICD-10-CM | POA: Diagnosis not present

## 2021-06-22 DIAGNOSIS — Z85828 Personal history of other malignant neoplasm of skin: Secondary | ICD-10-CM

## 2021-06-22 DIAGNOSIS — D492 Neoplasm of unspecified behavior of bone, soft tissue, and skin: Secondary | ICD-10-CM

## 2021-06-22 DIAGNOSIS — C44519 Basal cell carcinoma of skin of other part of trunk: Secondary | ICD-10-CM

## 2021-06-22 NOTE — Progress Notes (Signed)
New Patient Visit  Subjective  Michael Lane is a 50 y.o. male who presents for the following: Skin Problem (Pt c/o new skin lesions he that recently appeared on his chest ). Hx of BCC right lower eyelid infraorbital 2015.   The following portions of the chart were reviewed this encounter and updated as appropriate:   Tobacco  Allergies  Meds  Problems  Med Hx  Surg Hx  Fam Hx     Review of Systems:  No other skin or systemic complaints except as noted in HPI or Assessment and Plan.  Objective  Well appearing patient in no apparent distress; mood and affect are within normal limits.  A focused examination was performed including face,chest. Relevant physical exam findings are noted in the Assessment and Plan.  left mid infraclavicular 1.1 x 0.6 cm pink irregular papule    Assessment & Plan  Neoplasm of skin left mid infraclavicular  Epidermal / dermal shaving  Lesion diameter (cm):  1.1 Informed consent: discussed and consent obtained   Timeout: patient name, date of birth, surgical site, and procedure verified   Procedure prep:  Patient was prepped and draped in usual sterile fashion Prep type:  Isopropyl alcohol Anesthesia: the lesion was anesthetized in a standard fashion   Anesthetic:  1% lidocaine w/ epinephrine 1-100,000 buffered w/ 8.4% NaHCO3 Hemostasis achieved with: pressure, aluminum chloride and electrodesiccation   Outcome: patient tolerated procedure well   Post-procedure details: sterile dressing applied and wound care instructions given   Dressing type: bandage and petrolatum    Destruction of lesion  Destruction method: electrodesiccation and curettage   Informed consent: discussed and consent obtained   Timeout:  patient name, date of birth, surgical site, and procedure verified Anesthesia: the lesion was anesthetized in a standard fashion   Anesthetic:  1% lidocaine w/ epinephrine 1-100,000 buffered w/ 8.4% NaHCO3 Curettage performed in  three different directions: Yes   Electrodesiccation performed over the curetted area: Yes   Curettage cycles:  3 Lesion length (cm):  1.1 Lesion width (cm):  1.1 Margin per side (cm):  0.2 Final wound size (cm):  1.5 Hemostasis achieved with:  electrodesiccation Outcome: patient tolerated procedure well with no complications   Post-procedure details: sterile dressing applied and wound care instructions given   Dressing type: petrolatum    Specimen 1 - Surgical pathology Differential Diagnosis: R/O BCC  Check Margins: No  Actinic Damage - chronic, secondary to cumulative UV radiation exposure/sun exposure over time - diffuse scaly erythematous macules with underlying dyspigmentation - Recommend daily broad spectrum sunscreen SPF 30+ to sun-exposed areas, reapply every 2 hours as needed.  - Recommend staying in the shade or wearing long sleeves, sun glasses (UVA+UVB protection) and wide brim hats (4-inch brim around the entire circumference of the hat). - Call for new or changing lesions.   History of Basal Cell Carcinoma of the Skin Right lower eyelid infraorbital 2015 - No evidence of recurrence today - Recommend regular full body skin exams - Recommend daily broad spectrum sunscreen SPF 30+ to sun-exposed areas, reapply every 2 hours as needed.  - Call if any new or changing lesions are noted between office visits   Melanocytic Nevi Left superior chest-pt report no changes in over 15 years  - Tan-brown and/or pink-flesh-colored symmetric macules and papules - Benign appearing on exam today - Observation - Call clinic for new or changing moles - Recommend daily use of broad spectrum spf 30+ sunscreen to sun-exposed areas.  Return in about 6 months (around 12/23/2021) for recheck chest .  I, Marye Round, CMA, am acting as scribe for Sarina Ser, MD .  Documentation: I have reviewed the above documentation for accuracy and completeness, and I agree with the  above.  Sarina Ser, MD

## 2021-06-22 NOTE — Patient Instructions (Addendum)
Wound Care Instructions  Cleanse wound gently with soap and water once a day then pat dry with clean gauze. Apply a thing coat of Petrolatum (petroleum jelly, "Vaseline") over the wound (unless you have an allergy to this). We recommend that you use a new, sterile tube of Vaseline. Do not pick or remove scabs. Do not remove the yellow or white "healing tissue" from the base of the wound.  Cover the wound with fresh, clean, nonstick gauze and secure with paper tape. You may use Band-Aids in place of gauze and tape if the would is small enough, but would recommend trimming much of the tape off as there is often too much. Sometimes Band-Aids can irritate the skin.  You should call the office for your biopsy report after 1 week if you have not already been contacted.  If you experience any problems, such as abnormal amounts of bleeding, swelling, significant bruising, significant pain, or evidence of infection, please call the office immediately.  FOR ADULT SURGERY PATIENTS: If you need something for pain relief you may take 1 extra strength Tylenol (acetaminophen) AND 2 Ibuprofen (200mg each) together every 4 hours as needed for pain. (do not take these if you are allergic to them or if you have a reason you should not take them.) Typically, you may only need pain medication for 1 to 3 days.   If you have any questions or concerns for your doctor, please call our main line at [phone] and press option 4 to reach your doctor's medical assistant. If no one answers, please leave a voicemail as directed and we will return your call as soon as possible. Messages left after 4 pm will be answered the following business day.   You may also send us a message via MyChart. We typically respond to MyChart messages within 1-2 business days.  For prescription refills, please ask your pharmacy to contact our office. Our fax number is [phone].  If you have an urgent issue when the clinic is closed that  cannot wait until the next business day, you can page your doctor at the number below.    Please note that while we do our best to be available for urgent issues outside of office hours, we are not available 24/7.   If you have an urgent issue and are unable to reach us, you may choose to seek medical care at your doctor's office, retail clinic, urgent care center, or emergency room.  If you have a medical emergency, please immediately call 911 or go to the emergency department.  Pager Numbers  - Dr. Kowalski: [phone]  - Dr. Moye: [phone]  - Dr. Stewart: [phone]  In the event of inclement weather, please call our main line at [phone] for an update on the status of any delays or closures.  Dermatology Medication Tips: Please keep the boxes that topical medications come in in order to help keep track of the instructions about where and how to use these. Pharmacies typically print the medication instructions only on the boxes and not directly on the medication tubes.   If your medication is too expensive, please contact our office at [phone] option 4 or send us a message through MyChart.   We are unable to tell what your co-pay for medications will be in advance as this is different depending on your insurance coverage. However, we may be able to find a substitute medication at lower cost or fill out paperwork to get insurance to cover a needed   medication.   If a prior authorization is required to get your medication covered by your insurance company, please allow us 1-2 business days to complete this process.  Drug prices often vary depending on where the prescription is filled and some pharmacies may offer cheaper prices.  The website www.goodrx.com contains coupons for medications through different pharmacies. The prices here do not account for what the cost may be with help from insurance (it may be cheaper with your insurance), but the website can give you the  price if you did not use any insurance.  - You can print the associated coupon and take it with your prescription to the pharmacy.  - You may also stop by our office during regular business hours and pick up a GoodRx coupon card.  - If you need your prescription sent electronically to a different pharmacy, notify our office through [facility] MyChart or by phone at [phone] option 4.   

## 2021-06-25 ENCOUNTER — Encounter: Payer: Self-pay | Admitting: Dermatology

## 2021-07-04 ENCOUNTER — Telehealth: Payer: Self-pay

## 2021-07-04 NOTE — Telephone Encounter (Signed)
-----   Message from Ralene Bathe, MD sent at 06/29/2021  5:38 PM EDT ----- Diagnosis Skin , left mid infraclavicular BASAL CELL CARCINOMA, NODULAR AND INFILTRATIVE PATTERNS, BASE INVOLVED  Cancer - BCC Already treated Recheck next visit

## 2021-07-04 NOTE — Telephone Encounter (Signed)
LM on VM please return my call  

## 2021-07-05 ENCOUNTER — Telehealth: Payer: Self-pay

## 2021-07-05 NOTE — Telephone Encounter (Signed)
-----   Message from Ralene Bathe, MD sent at 06/29/2021  5:38 PM EDT ----- Diagnosis Skin , left mid infraclavicular BASAL CELL CARCINOMA, NODULAR AND INFILTRATIVE PATTERNS, BASE INVOLVED  Cancer - BCC Already treated Recheck next visit

## 2021-07-05 NOTE — Telephone Encounter (Signed)
Patient advised of BX results.  °

## 2021-09-14 ENCOUNTER — Other Ambulatory Visit: Payer: Self-pay | Admitting: Internal Medicine

## 2021-10-10 ENCOUNTER — Ambulatory Visit (INDEPENDENT_AMBULATORY_CARE_PROVIDER_SITE_OTHER): Payer: 59 | Admitting: Adult Health

## 2021-10-10 ENCOUNTER — Encounter: Payer: Self-pay | Admitting: Adult Health

## 2021-10-10 ENCOUNTER — Other Ambulatory Visit: Payer: Self-pay | Admitting: Adult Health

## 2021-10-10 ENCOUNTER — Ambulatory Visit
Admission: RE | Admit: 2021-10-10 | Discharge: 2021-10-10 | Disposition: A | Discharge status: Home / Self Care | Payer: 59 | Source: Ambulatory Visit | Attending: Adult Health | Admitting: Adult Health

## 2021-10-10 ENCOUNTER — Other Ambulatory Visit: Payer: Self-pay

## 2021-10-10 VITALS — BP 137/80 | HR 93 | Temp 96.0°F | Ht 72.0 in | Wt 258.2 lb

## 2021-10-10 DIAGNOSIS — R748 Abnormal levels of other serum enzymes: Secondary | ICD-10-CM

## 2021-10-10 DIAGNOSIS — R109 Unspecified abdominal pain: Secondary | ICD-10-CM

## 2021-10-10 DIAGNOSIS — R1011 Right upper quadrant pain: Secondary | ICD-10-CM | POA: Insufficient documentation

## 2021-10-10 LAB — COMPREHENSIVE METABOLIC PANEL
ALT: 124 U/L — ABNORMAL HIGH (ref 0–53)
AST: 68 U/L — ABNORMAL HIGH (ref 0–37)
Albumin: 4.3 g/dL (ref 3.5–5.2)
Alkaline Phosphatase: 76 U/L (ref 39–117)
BUN: 10 mg/dL (ref 6–23)
CO2: 29 mEq/L (ref 19–32)
Calcium: 9.4 mg/dL (ref 8.4–10.5)
Chloride: 104 mEq/L (ref 96–112)
Creatinine, Ser: 0.94 mg/dL (ref 0.40–1.50)
GFR: 94.36 mL/min (ref >60.00)
Glucose, Bld: 98 mg/dL (ref 70–99)
Potassium: 4.3 mEq/L (ref 3.5–5.1)
Sodium: 138 mEq/L (ref 135–145)
Total Bilirubin: 0.8 mg/dL (ref 0.2–1.2)
Total Protein: 6.9 g/dL (ref 6.0–8.3)

## 2021-10-10 LAB — CBC WITH DIFFERENTIAL/PLATELET
Basophils Absolute: 0 10*3/uL (ref 0.0–0.1)
Basophils Relative: 0.4 % (ref 0.0–3.0)
Eosinophils Absolute: 0.1 10*3/uL (ref 0.0–0.7)
Eosinophils Relative: 0.9 % (ref 0.0–5.0)
HCT: 52.4 % — ABNORMAL HIGH (ref 39.0–52.0)
Hemoglobin: 17.5 g/dL — ABNORMAL HIGH (ref 13.0–17.0)
Lymphocytes Relative: 39.6 % (ref 12.0–46.0)
Lymphs Abs: 3.3 10*3/uL (ref 0.7–4.0)
MCHC: 33.4 g/dL (ref 30.0–36.0)
MCV: 95.1 fl (ref 78.0–100.0)
Monocytes Absolute: 0.6 10*3/uL (ref 0.1–1.0)
Monocytes Relative: 7.8 % (ref 3.0–12.0)
Neutro Abs: 4.3 10*3/uL (ref 1.4–7.7)
Neutrophils Relative %: 51.3 % (ref 43.0–77.0)
Platelets: 302 10*3/uL (ref 150.0–400.0)
RBC: 5.51 Mil/uL (ref 4.22–5.81)
RDW: 13.9 % (ref 11.5–15.5)
WBC: 8.3 10*3/uL (ref 4.0–10.5)

## 2021-10-10 LAB — URINALYSIS, MICROSCOPIC ONLY

## 2021-10-10 NOTE — Progress Notes (Signed)
Fatty liver seen on scan.  Colon diverticulitis seen no active diverticulitis. Increase fiber such as Metamucil per package instructions- increase liquids.  If never had colonoscopy would advise.  Aortic atherosclerosis seen on CT. Cholesterol reduction/ healthy diet advised. No renal stone seen.  If no improvement in symptoms return to office for follow up.

## 2021-10-10 NOTE — Patient Instructions (Signed)
Kidney Stones Kidney stones are rock-like masses that form inside of the kidneys. Kidneys are organs that make pee (urine). A kidney stone may move into other parts of the urinary tract, including: The tubes that connect the kidneys to the bladder (ureters). The bladder. The tube that carries urine out of the body (urethra). Kidney stones can cause very bad pain and can block the flow of pee. The stone usually leaves your body (passes) through your pee. You may need to have a doctor take out the stone. What are the causes? Kidney stones may be caused by: A condition in which certain glands make too much parathyroid hormone (primary hyperparathyroidism). A buildup of a type of crystals in the bladder made of a chemical called uric acid. The body makes uric acid when you eat certain foods. Narrowing (stricture) of one or both of the ureters. A kidney blockage that you were born with. Past surgery on the kidney or the ureters, such as gastric bypass surgery. What increases the risk? You are more likely to develop this condition if: You have had a kidney stone in the past. You have a family history of kidney stones. You do not drink enough water. You eat a diet that is high in protein, salt (sodium), or sugar. You are overweight or very overweight (obese). What are the signs or symptoms? Symptoms of a kidney stone may include: Pain in the side of the belly, right below the ribs (flank pain). Pain usually spreads (radiates) to the groin. Needing to pee often or right away (urgently). Pain when going pee (urinating). Blood in your pee (hematuria). Feeling like you may vomit (nauseous). Vomiting. Fever and chills. How is this treated? Treatment depends on the size, location, and makeup of the kidney stones. The stones will often pass out of the body through peeing. You may need to: Drink more fluid to help pass the stone. In some cases, you may be given fluids through an IV tube put into one  of your veins at the hospital. Take medicine for pain. Make changes in your diet to help keep kidney stones from coming back. Sometimes, medical procedures are needed to remove a kidney stone. This may involve: A procedure to break up kidney stones using a beam of light (laser) or shock waves. Surgery to remove the kidney stones. Follow these instructions at home: Medicines Take over-the-counter and prescription medicines only as told by your doctor. Ask your doctor if the medicine prescribed to you requires you to avoid driving or using heavy machinery. Eating and drinking Drink enough fluid to keep your pee pale yellow. You may be told to drink at least 8-10 glasses of water each day. This will help you pass the stone. If told by your doctor, change your diet. This may include: Limiting how much salt you eat. Eating more fruits and vegetables. Limiting how much meat, poultry, fish, and eggs you eat. Follow instructions from your doctor about eating or drinking restrictions. General instructions Collect pee samples as told by your doctor. You may need to collect a pee sample: 24 hours after a stone comes out. 8-12 weeks after a stone comes out, and every 6-12 months after that. Strain your pee every time you pee (urinate), for as long as told. Use the strainer that your doctor recommends. Do not throw out the stone. Keep it so that it can be tested by your doctor. Keep all follow-up visits as told by your doctor. This is important. You may need   follow-up tests. How is this prevented? To prevent another kidney stone: Drink enough fluid to keep your pee pale yellow. This is the best way to prevent kidney stones. Eat healthy foods. Avoid certain foods as told by your doctor. You may be told to eat less protein. Stay at a healthy weight. Where to find more information [place] [facility]): www.kidney.[facility]): www.urologyhealth.org Contact a doctor  if: You have pain that gets worse or does not get better with medicine. Get help right away if: You have a fever or chills. You get very bad pain. You get new pain in your belly (abdomen). You pass out (faint). You cannot pee. Summary Kidney stones are rock-like masses that form inside of the kidneys. Kidney stones can cause very bad pain and can block the flow of pee. The stones will often pass out of the body through peeing. Drink enough fluid to keep your pee pale yellow. This information is not intended to replace advice given to you by your health care provider. Make sure you discuss any questions you have with your health care provider. Document Revised: 06/26/2021 Document Reviewed: 06/26/2021 Elsevier Patient Education  2022 [place]. Acute Back Pain, Adult Acute back pain is sudden and usually short-lived. It is often caused by an injury to the muscles and tissues in the back. The injury may result from: A muscle, tendon, or ligament getting overstretched or torn. Ligaments are tissues that connect bones to each other. Lifting something improperly can cause a back strain. Wear and tear (degeneration) of the spinal disks. Spinal disks are circular tissue that provide cushioning between the bones of the spine (vertebrae). Twisting motions, such as while playing sports or doing yard work. A hit to the back. Arthritis. You may have a physical exam, lab tests, and imaging tests to find the cause of your pain. Acute back pain usually goes away with rest and home care. Follow these instructions at home: Managing pain, stiffness, and swelling Take over-the-counter and prescription medicines only as told by your health care provider. Treatment may include medicines for pain and inflammation that are taken by mouth or applied to the skin, or muscle relaxants. Your health care provider may recommend applying ice during the first 24-48 hours after your pain starts. To do this: Put ice  in a plastic bag. Place a towel between your skin and the bag. Leave the ice on for 20 minutes, 2-3 times a day. Remove the ice if your skin turns bright red. This is very important. If you cannot feel pain, heat, or cold, you have a greater risk of damage to the area. If directed, apply heat to the affected area as often as told by your health care provider. Use the heat source that your health care provider recommends, such as a moist heat pack or a heating pad. Place a towel between your skin and the heat source. Leave the heat on for 20-30 minutes. Remove the heat if your skin turns bright red. This is especially important if you are unable to feel pain, heat, or cold. You have a greater risk of getting burned. Activity  Do not stay in bed. Staying in bed for more than 1-2 days can delay your recovery. Sit up and stand up straight. Avoid leaning forward when you sit or hunching over when you stand. If you work at a desk, sit close to it so you do not need to lean over. Keep your chin tucked in. Keep  your neck drawn back, and keep your elbows bent at a 90-degree angle (right angle). Sit high and close to the steering wheel when you drive. Add lower back (lumbar) support to your car seat, if needed. Take short walks on even surfaces as soon as you are able. Try to increase the length of time you walk each day. Do not sit, drive, or stand in one place for more than 30 minutes at a time. Sitting or standing for long periods of time can put stress on your back. Do not drive or use heavy machinery while taking prescription pain medicine. Use proper lifting techniques. When you bend and lift, use positions that put less stress on your back: [place] your knees. Keep the load close to your body. Avoid twisting. Exercise regularly as told by your health care provider. Exercising helps your back heal faster and helps prevent back injuries by keeping muscles strong and flexible. Work with a physical  therapist to make a safe exercise program, as recommended by your health care provider. Do any exercises as told by your physical therapist. Lifestyle Maintain a healthy weight. Extra weight puts stress on your back and makes it difficult to have good posture. Avoid activities or situations that make you feel anxious or stressed. Stress and anxiety increase muscle tension and can make back pain worse. Learn ways to manage anxiety and stress, such as through exercise. General instructions Sleep on a firm mattress in a comfortable position. Try lying on your side with your knees slightly bent. If you lie on your back, put a pillow under your knees. Keep your head and neck in a straight line with your spine (neutral position) when using electronic equipment like smartphones or pads. To do this: Raise your smartphone or pad to look at it instead of bending your head or neck to look down. Put the smartphone or pad at the level of your face while looking at the screen. Follow your treatment plan as told by your health care provider. This may include: Cognitive or behavioral therapy. Acupuncture or massage therapy. Meditation or yoga. Contact a health care provider if: You have pain that is not relieved with rest or medicine. You have increasing pain going down into your legs or buttocks. Your pain does not improve after 2 weeks. You have pain at night. You lose weight without trying. You have a fever or chills. You develop nausea or vomiting. You develop abdominal pain. Get help right away if: You develop new bowel or bladder control problems. You have unusual weakness or numbness in your arms or legs. You feel faint. These symptoms may represent a serious problem that is an emergency. Do not wait to see if the symptoms will go away. Get medical help right away. Call your local emergency services (911 in the [place] Do not drive yourself to the hospital. Summary Acute back pain is sudden and  usually short-lived. Use proper lifting techniques. When you bend and lift, use positions that put less stress on your back. Take over-the-counter and prescription medicines only as told by your health care provider, and apply heat or ice as told. This information is not intended to replace advice given to you by your health care provider. Make sure you discuss any questions you have with your health care provider. Document Revised: 01/13/2021 Document Reviewed: 01/13/2021 Elsevier Patient Education  [place].

## 2021-10-10 NOTE — Progress Notes (Signed)
Urine within normal limits.

## 2021-10-10 NOTE — Progress Notes (Signed)
Liver enzymes are elevated, patient had no abdominal tenderness on exam.  Please order acute hepatitis panel, and have patient come back and have that lab drawn.  Do recommend avoiding alcohol and Tylenol.  Will order ultrasound of right upper quadrant of abdomen to rule out liver concerns. Hemoglobin is scantly elevated as well as hematocrit if taking any over-the-counter vitamins with iron would recommend Stopping those. Lab orders placed he can schedule.  Will also need repeat CMP in 6 weeks

## 2021-10-10 NOTE — Progress Notes (Signed)
No orders of the defined types were placed in this encounter. Orders Placed This Encounter  Procedures   US Abdomen Limited RUQ (LIVER/GB)    Order Specific Question:   Reason for Exam (SYMPTOM  OR DIAGNOSIS REQUIRED)    Answer:   elevated liver enzymes, flank pain no kidney stone on CT rule out liver or gallbladder    Order Specific Question:   Preferred imaging location?    Answer:   [facility]   Hepatitis, Acute    Standing Status:   Future    Standing Expiration Date:   10/10/2022   Hepatitis C Antibody    Standing Status:   Future    Standing Expiration Date:   10/10/2022   Elevated liver enzymes - Plan: US Abdomen Limited RUQ (LIVER/GB), Hepatitis, Acute, Hepatitis C Antibody  Acute right flank pain - Plan: US Abdomen Limited RUQ (LIVER/GB), Hepatitis, Acute, Hepatitis C Antibody

## 2021-10-10 NOTE — Progress Notes (Addendum)
Acute Office Visit  Subjective:    Patient ID: Michael Lane, male    DOB: [birth year], 50 y.o.   MRN: [id number]  Chief Complaint  Patient presents with   Acute Visit    Right side pain    HPI Patient is in today for right sided mid upper back pain that started 2 months ago and now it feels like it has radiated to his side. He feels it is not severe pain just something does not feel right " in there" .  Denies any  SIGNIFICANT urinary symptoms.  Denies any rectal pain or pressure, or constipation.  Denies any injury, denies any pain worsening with twisting or movement.   He has no other symptoms. Denies any pain worse with eating.  He denies any pain at this time.  He cut back on sodas, and switched to water with some lemon in it.  Patient  denies any fever, body aches,chills, rash, chest pain, shortness of breath, nausea, vomiting, or diarrhea.  Denies any syncopal or presyncopal episodes.  Denies any dizziness lightheadedness.  He feels well otherwise   Past Medical History:  Diagnosis Date   Basal cell carcinoma 06/08/2014   right lower eyelid infraorbitial   Basal cell carcinoma 06/22/2021   left mid infraclavicular, EDC   Hypertension     Past Surgical History:  Procedure Laterality Date   VARICOCELE EXCISION      Family History  Problem Relation Age of Onset   Arthritis Mother    Cancer Mother        breast   Breast cancer Mother    Kidney disease Father        H/O kidney stones   Hypertension Father     Social History   Socioeconomic History   Marital status: Single    Spouse name: Not on file   Number of children: Not on file   Years of education: Not on file   Highest education level: Not on file  Occupational History   Not on file  Tobacco Use   Smoking status: Every Day    Packs/day: 1.00    Years: 25.00    Pack years: 25.00    Types: Cigarettes   Smokeless tobacco: Never   Tobacco comments:    Quit smoking in February 2015  Substance  and Sexual Activity   Alcohol use: No    Alcohol/week: 0.0 standard drinks   Drug use: No    Types: Marijuana    Comment: past   Sexual activity: Not on file  Other Topics Concern   Not on file  Social History Narrative   Not on file   Social Determinants of Health   Financial Resource Strain: Not on file  Food Insecurity: Not on file  Transportation Needs: Not on file  Physical Activity: Not on file  Stress: Not on file  Social Connections: Not on file  Intimate Partner Violence: Not on file    Outpatient Medications Prior to Visit  Medication Sig Dispense Refill   amLODipine (NORVASC) 10 MG tablet TAKE 1 TABLET BY MOUTH EVERY DAY 90 tablet 3   losartan (COZAAR) 25 MG tablet TAKE **2** TABLETS BY MOUTH EVERY DAY (50 MG ON BACKORDER) 180 tablet 1   losartan (COZAAR) 50 MG tablet TAKE ONE TABLET BY MOUTH DAILY 90 tablet 1   diclofenac sodium (VOLTAREN) 1 % GEL Apply 2 g topically 3 (three) times daily as needed. 100 g 3   No facility-administered medications prior to  visit.    Allergies  Allergen Reactions   Codeine Nausea And Vomiting   Shrimp [Shellfish Allergy]     Review of Systems  Constitutional: Negative.   HENT: Negative.    Respiratory: Negative.    Cardiovascular: Negative.   Gastrointestinal: Negative.   Genitourinary:  Positive for flank pain. Negative for difficulty urinating, scrotal swelling and testicular pain.  Musculoskeletal:  Positive for back pain.  Neurological: Negative.   Hematological: Negative.   Psychiatric/Behavioral: Negative.        Objective:    Physical Exam Constitutional:      General: He is not in acute distress.    Appearance: Normal appearance. He is well-developed. He is not ill-appearing, toxic-appearing or diaphoretic.  HENT:     Head: Normocephalic and atraumatic.     Right Ear: Hearing, tympanic membrane, ear canal and external ear normal.     Left Ear: Hearing, tympanic membrane, ear canal and external ear normal.      Nose: Nose normal.     Mouth/Throat:     Pharynx: Uvula midline. No oropharyngeal exudate.  Eyes:     General: Lids are normal. No scleral icterus.       Right eye: No discharge.        Left eye: No discharge.     Conjunctiva/sclera: Conjunctivae normal.     Pupils: Pupils are equal, round, and reactive to light.  Neck:     Thyroid: No thyromegaly.     Vascular: Normal carotid pulses. No carotid bruit, hepatojugular reflux or JVD.     Trachea: Trachea and phonation normal. No tracheal tenderness or tracheal deviation.     Meningeal: Brudzinski's sign absent.  Cardiovascular:     Rate and Rhythm: Normal rate and regular rhythm.     Pulses: Normal pulses.     Heart sounds: Normal heart sounds, S1 normal and S2 normal. Heart sounds not distant. No murmur heard.   No friction rub. No gallop.  Pulmonary:     Effort: Pulmonary effort is normal. No accessory muscle usage or respiratory distress.     Breath sounds: Normal breath sounds. No stridor. No wheezing or rales.  Chest:     Chest wall: No tenderness.  Abdominal:     General: Bowel sounds are normal. There is no distension.     Palpations: Abdomen is soft. There is no mass.     Tenderness: There is no abdominal tenderness. There is no right CVA tenderness, left CVA tenderness, guarding or rebound.     Hernia: No hernia is present.  Musculoskeletal:        General: No tenderness or deformity. Normal range of motion.     Cervical back: Normal, full passive range of motion without pain, normal range of motion and neck supple.     Thoracic back: Normal.     Lumbar back: Normal.       Back:     Comments: Pain radiates around to right mid abdomen.   Lymphadenopathy:     Head:     Right side of head: No submental, submandibular, tonsillar, preauricular, posterior auricular or occipital adenopathy.     Left side of head: No submental, submandibular, tonsillar, preauricular, posterior auricular or occipital adenopathy.      Cervical: No cervical adenopathy.  Skin:    General: Skin is warm and dry.     Coloration: Skin is not pale.     Findings: No erythema or rash.     Nails: There is no  clubbing.  Neurological:     Mental Status: He is alert and oriented to person, place, and time.     GCS: GCS eye subscore is 4. GCS verbal subscore is 5. GCS motor subscore is 6.     Cranial Nerves: No cranial nerve deficit.     Sensory: No sensory deficit.     Motor: No abnormal muscle tone.     Coordination: Coordination normal.     Deep Tendon Reflexes: Reflexes are normal and symmetric. Reflexes normal.  Psychiatric:        Speech: Speech normal.        Behavior: Behavior normal.        Thought Content: Thought content normal.        Judgment: Judgment normal.    BP 137/80 (BP Location: Left Arm, Patient Position: Sitting, Cuff Size: Large) Comment: recheck  Pulse 93   Temp (!) 96 F (35.6 C) (Temporal)   Ht 6' (1.829 m)   Wt 258 lb 3.2 oz (117.1 kg)   SpO2 96%   BMI 35.02 kg/m  Wt Readings from Last 3 Encounters:  10/10/21 258 lb 3.2 oz (117.1 kg)  02/22/20 213 lb 6.4 oz (96.8 kg)  05/29/19 255 lb (115.7 kg)   Vitals with BMI 10/10/2021 02/22/2020 05/29/2019  Height 6\' 0"  6\' 0"  6\' 0"   Weight 258 lbs 3 oz 213 lbs 6 oz 255 lbs  BMI 35.01 93.71 69.67  Systolic 893 810 175  Diastolic 80 80 80  Pulse 93 80 75     Health Maintenance Due  Topic Date Due   Pneumococcal Vaccine 72-67 Years old (1 - PCV) Never done   OPHTHALMOLOGY EXAM  Never done   HIV Screening  Never done   TETANUS/TDAP  Never done   Zoster Vaccines- Shingrix (1 of 2) Never done   COLONOSCOPY (Pts 45-68yrs Insurance coverage will need to be confirmed)  Never done   FOOT EXAM  09/10/2019   HEMOGLOBIN A1C  08/25/2020    There are no preventive care reminders to display for this patient.   Lab Results  Component Value Date   TSH 1.26 02/24/2020   Lab Results  Component Value Date   WBC 8.3 10/10/2021   HGB 17.5 (H) 10/10/2021    HCT 52.4 (H) 10/10/2021   MCV 95.1 10/10/2021   PLT 302.0 10/10/2021   Lab Results  Component Value Date   NA 138 10/10/2021   K 4.3 10/10/2021   CO2 29 10/10/2021   GLUCOSE 98 10/10/2021   BUN 10 10/10/2021   CREATININE 0.94 10/10/2021   BILITOT 0.8 10/10/2021   ALKPHOS 76 10/10/2021   AST 68 (H) 10/10/2021   ALT 124 (H) 10/10/2021   PROT 6.9 10/10/2021   ALBUMIN 4.3 10/10/2021   CALCIUM 9.4 10/10/2021   GFR 94.36 10/10/2021   Lab Results  Component Value Date   CHOL 174 02/24/2020   Lab Results  Component Value Date   HDL 47.30 02/24/2020   Lab Results  Component Value Date   LDLCALC 114 (H) 02/24/2020   Lab Results  Component Value Date   TRIG 63.0 02/24/2020   Lab Results  Component Value Date   CHOLHDL 4 02/24/2020   Lab Results  Component Value Date   HGBA1C 5.7 02/24/2020       Assessment & Plan:   Problem List Items Addressed This Visit   None Visit Diagnoses     Right upper quadrant abdominal pain    -  Primary  Relevant Orders   CBC with Differential/Platelet (Completed)   Comprehensive metabolic panel (Completed)   Urine Microscopic Only (Completed)   CT RENAL STONE STUDY (Completed)     CT to rule out renal stone.  Patient really does not have any symptoms of urinary problems at this time, however his pain has come and gone over the last 2 months intermittently.  We will rule out renal stone on CT today. Question is this is not musculoskeletal in nature given his intermittent pain in the lumbar sacral area.  Consider NSAID trial.  Possible muscle relaxer if CT returns within normal limits.  Follow-up at any time if symptoms or not improving. Labs today as well as urine microscopic.   Advised patient call the office or your primary care doctor for an appointment if no improvement within 72 hours or if any symptoms change or worsen at any time  Advised ER or urgent Care if after hours or on weekend. Call 911 for emergency symptoms at any  time.Patinet verbalized understanding of all instructions given/reviewed and treatment plan and has no further questions or concerns at this time.    Return in about 1 month (around 11/10/2021), or if symptoms worsen or fail to improve, for at any time for any worsening symptoms, Go to Emergency room/ urgent care if worse.   No orders of the defined types were placed in this encounter.    Marcille Buffy, FNP

## 2021-10-11 ENCOUNTER — Other Ambulatory Visit: Payer: Self-pay

## 2021-10-11 ENCOUNTER — Ambulatory Visit
Admission: RE | Admit: 2021-10-11 | Discharge: 2021-10-11 | Disposition: A | Discharge status: Home / Self Care | Payer: 59 | Source: Ambulatory Visit | Attending: Adult Health | Admitting: Adult Health

## 2021-10-11 DIAGNOSIS — R748 Abnormal levels of other serum enzymes: Secondary | ICD-10-CM | POA: Insufficient documentation

## 2021-10-11 DIAGNOSIS — R1011 Right upper quadrant pain: Secondary | ICD-10-CM | POA: Diagnosis present

## 2021-10-11 NOTE — Progress Notes (Signed)
Fatty liver on [place]. Monitor diet, increase exercise.

## 2021-11-22 ENCOUNTER — Other Ambulatory Visit: Payer: 59

## 2021-12-14 ENCOUNTER — Ambulatory Visit: Payer: 59 | Admitting: Dermatology

## 2022-04-26 DIAGNOSIS — R319 Hematuria, unspecified: Secondary | ICD-10-CM | POA: Diagnosis not present

## 2022-04-26 DIAGNOSIS — Z7251 High risk heterosexual behavior: Secondary | ICD-10-CM | POA: Diagnosis not present

## 2022-06-17 DIAGNOSIS — J0100 Acute maxillary sinusitis, unspecified: Secondary | ICD-10-CM | POA: Diagnosis not present

## 2022-10-03 ENCOUNTER — Other Ambulatory Visit: Payer: Self-pay | Admitting: Internal Medicine

## 2022-11-07 ENCOUNTER — Other Ambulatory Visit: Payer: Self-pay | Admitting: Internal Medicine

## 2022-12-12 ENCOUNTER — Other Ambulatory Visit: Payer: Self-pay | Admitting: Internal Medicine

## 2023-01-23 ENCOUNTER — Other Ambulatory Visit: Payer: Self-pay | Admitting: Internal Medicine

## 2023-01-23 NOTE — Telephone Encounter (Signed)
I have not seen him since 2021.  Please call and see if he is seeing someone else.  Needs an appt.  If has been taking regularly and is still seeing me, please schedule an appt and refill until appt.

## 2023-01-24 NOTE — Telephone Encounter (Signed)
Spoke with patient. Offered him an appointment. Pt declined. He says he has a doctor whos prescribing his medication but would like to keep you as a provider in case he needs anything. Also noted that he has not been getting his regular appointments because he currently does not have insurance. Asked patient who he is seeing that follows his blood pressure and prescribes medication. His response was "what does that matter to you, I told you I don't need medication"  Please refuse this medication.

## 2023-01-24 NOTE — Telephone Encounter (Signed)
Have not seen since 2021.  Larena Glassman discussed.  Seeing another provider for medication.

## 2023-06-24 ENCOUNTER — Other Ambulatory Visit: Payer: Self-pay | Admitting: Internal Medicine

## 2023-09-23 IMAGING — CT CT RENAL STONE PROTOCOL
2 of 4 series · 15 of 46 positions shown, 17 images · non-contrast
Comparison: No priors.

CLINICAL DATA: 50-year-old male with history of right upper
quadrant and right-sided flank pain. Possible kidney stone.

EXAM:
CT ABDOMEN AND PELVIS WITHOUT CONTRAST
TECHNIQUE: Multidetector CT imaging of the abdomen and pelvis was performed
following the standard protocol without IV contrast.

[Series 2: stone full standard · axial · 0.94mm/px · z∈[-990,-480]mm · 12 of 112 slices shown, 14 images]
[im 5/112  soft-tissue]
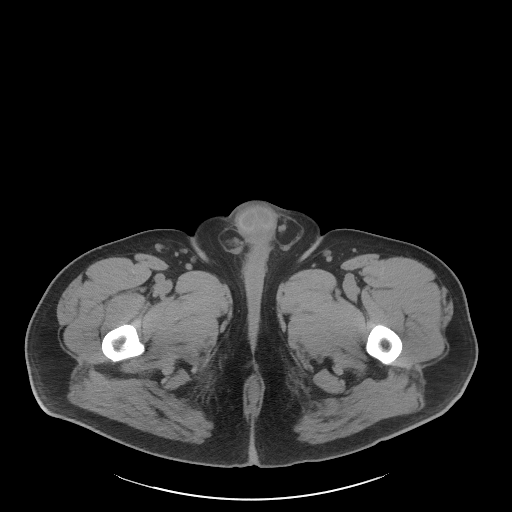
[im 5/112  bone]
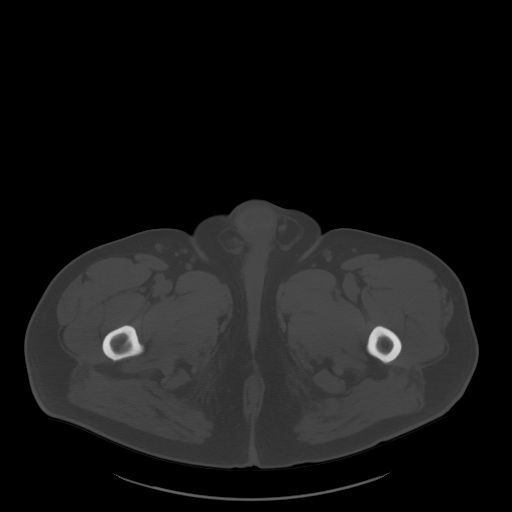
[im 15/112  soft-tissue]
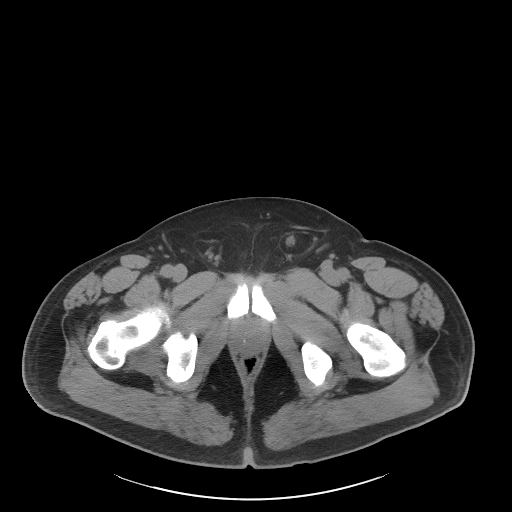
[im 25/112  soft-tissue]
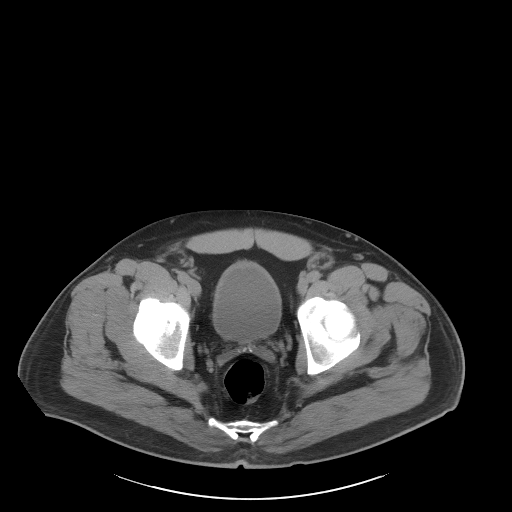
[im 34/112  soft-tissue]
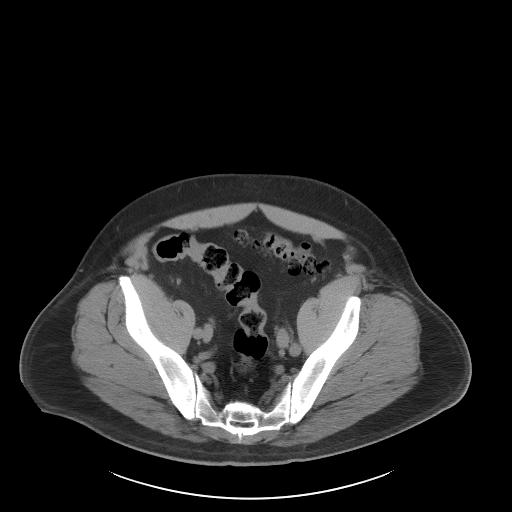
[im 44/112  soft-tissue]
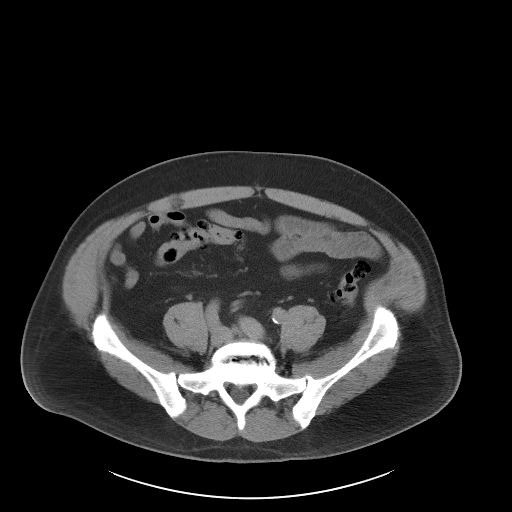
[im 54/112  soft-tissue]
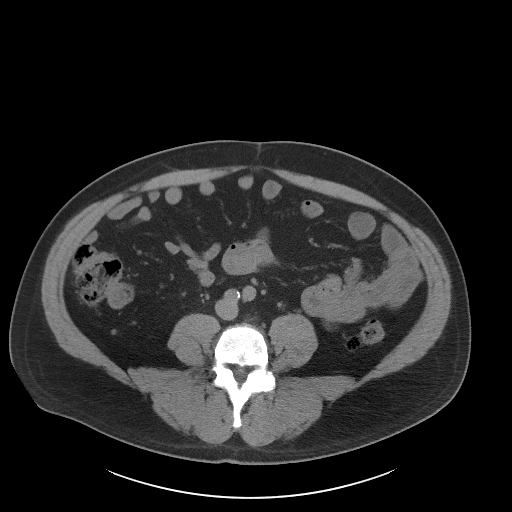
[im 58/112  soft-tissue]
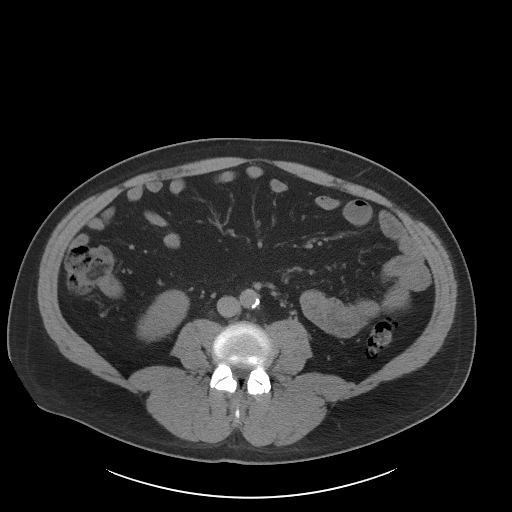
[im 68/112  soft-tissue]
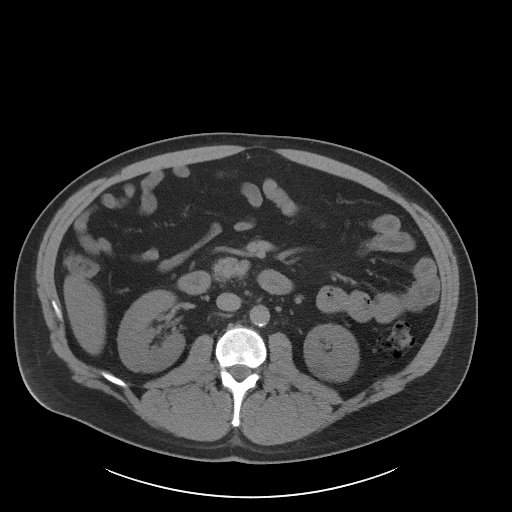
[im 78/112  soft-tissue]
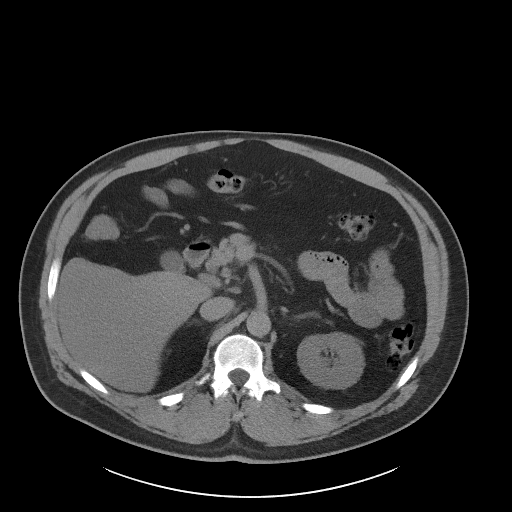
[im 78/112  bone]
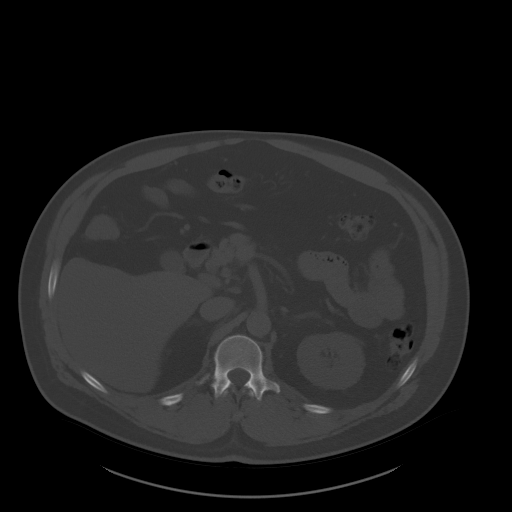
[im 87/112  soft-tissue]
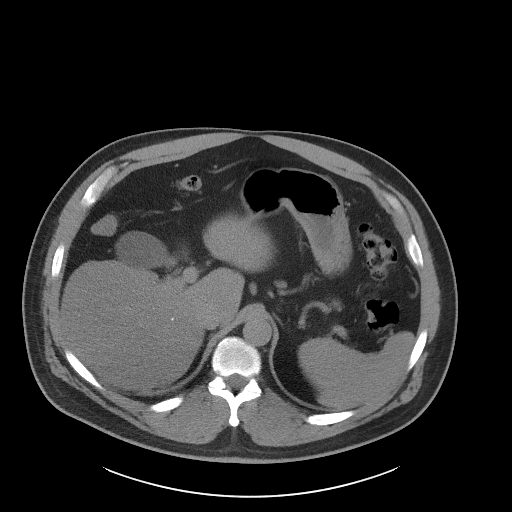
[im 97/112  soft-tissue]
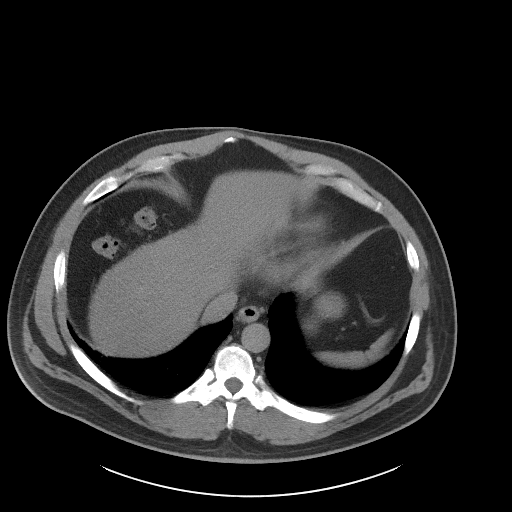
[im 107/112  soft-tissue]
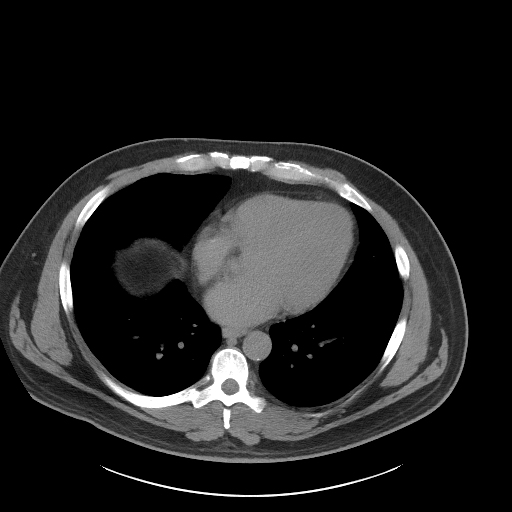

[Series 5: coronal · coronal · 0.89mm/px · 3 of 163 slices shown]
[im 55/163  soft-tissue]
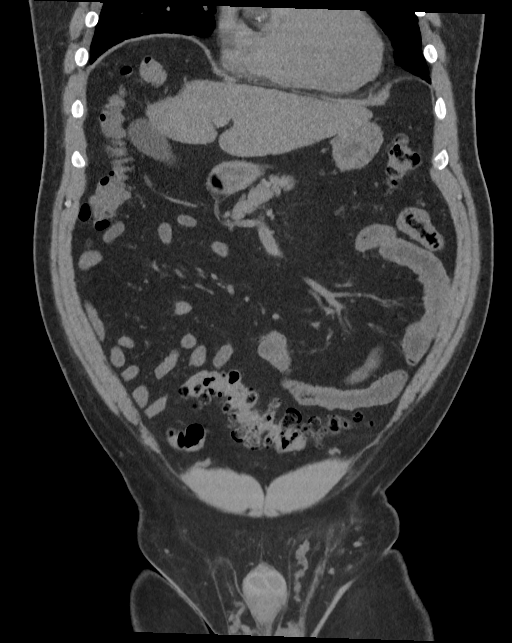
[im 73/163  soft-tissue]
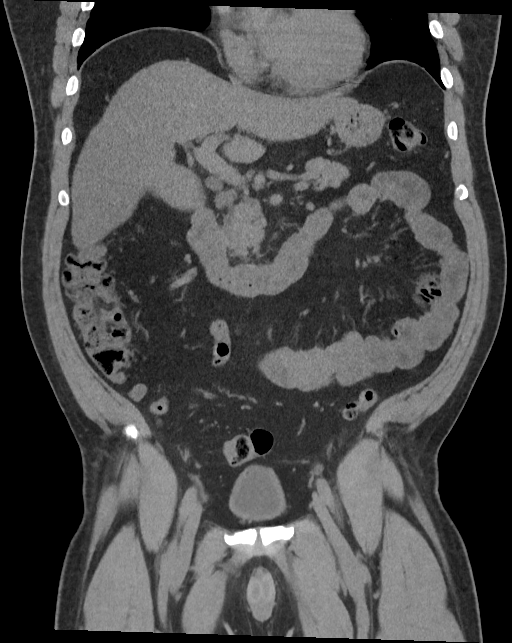
[im 91/163  soft-tissue]
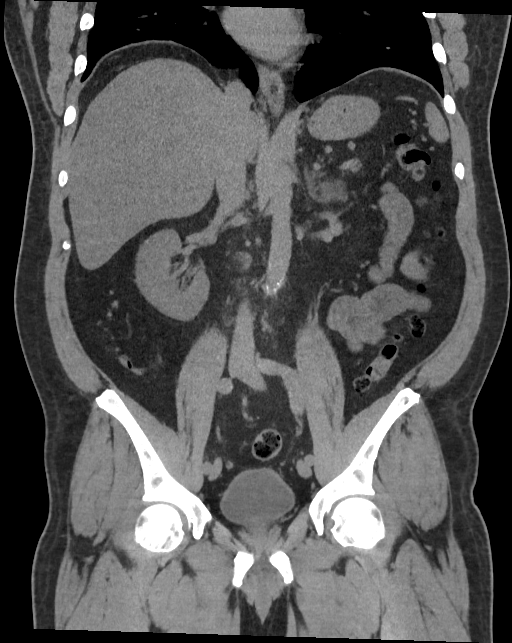

[15 of 46 positions shown; findings below may reference images not displayed]

FINDINGS: Lower chest: Atherosclerotic calcifications in the descending
thoracic aorta.

Hepatobiliary: Diffuse low attenuation throughout the visualized
hepatic parenchyma, indicative of hepatic steatosis. Multiple small
calcified granulomas are noted in the right lobe of the liver. No
other definite suspicious appearing hepatic lesions are confidently
identified on today's noncontrast CT examination. Unenhanced
appearance of the gallbladder is normal.

Pancreas: No definite pancreatic mass or peripancreatic fluid
collections or inflammatory changes are noted on today's noncontrast
CT examination.

Spleen: Unremarkable.

Adrenals/Urinary Tract: No calcifications are identified within the
collecting system of either kidney, along the course of either
ureter, or within the lumen of the urinary bladder. No
hydroureteronephrosis. 1.6 cm low-attenuation lesion in the lower
pole of the left kidney, incompletely characterized on today's
non-contrast CT examination, but statistically likely to represent a
small cyst. Right kidney and bilateral adrenal glands are otherwise
normal in appearance on today's noncontrast CT examination. Urinary
bladder is normal in appearance.

Stomach/Bowel: Unenhanced appearance of the stomach is normal. There
is no pathologic dilatation of small bowel or colon. Multiple
colonic diverticulae are noted, most evident in the descending colon
and sigmoid colon, without surrounding inflammatory changes to
suggest an acute diverticulitis at this time. Normal appendix.

Vascular/Lymphatic: Atherosclerotic calcifications throughout the
abdominal aorta and pelvic vasculature. No lymphadenopathy noted in
the abdomen or pelvis.

Reproductive: Prostate gland and seminal vesicles are unremarkable
in appearance.

Other: No significant volume of ascites.  No pneumoperitoneum.

Musculoskeletal: There are no aggressive appearing lytic or blastic
lesions noted in the visualized portions of the skeleton.
IMPRESSION: 1. No acute findings are noted to account for the patient's
symptoms.
2. Hepatic steatosis.
3. Colonic diverticulosis without evidence of acute diverticulitis
at this time.
4. Aortic atherosclerosis.

## 2023-09-24 IMAGING — US US ABDOMEN LIMITED
1 series · 14 of 25 positions shown · non-contrast
Comparison: CT yesterday.

CLINICAL DATA: Elevated liver enzymes.  Flank pain.

EXAM:
ULTRASOUND ABDOMEN LIMITED RIGHT UPPER QUADRANT

[Series 1: us abdomen limited · 0.28mm/px · 14 of 46 slices shown]
[im 1/46]
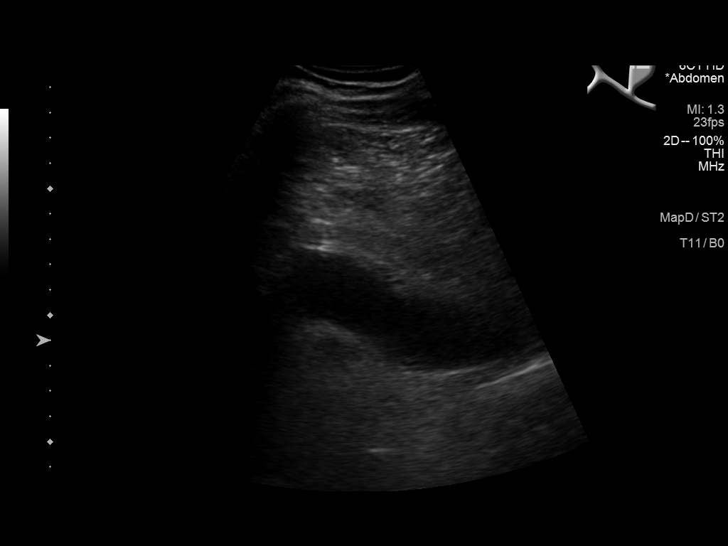
[im 4/46]
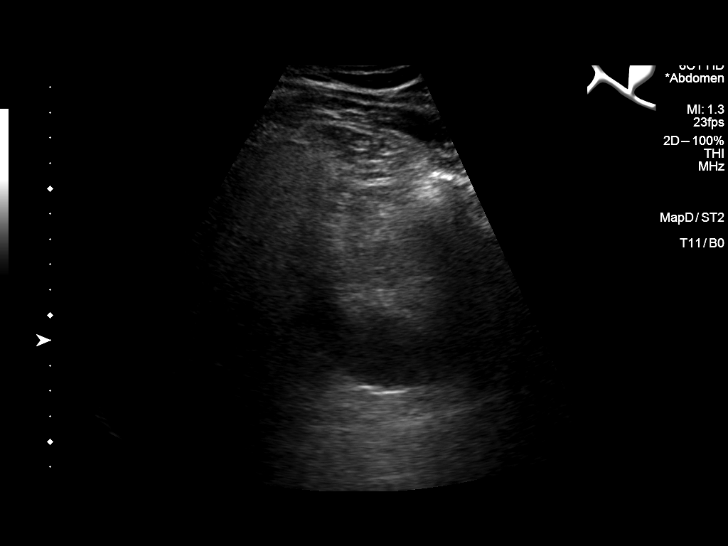
[im 8/46]
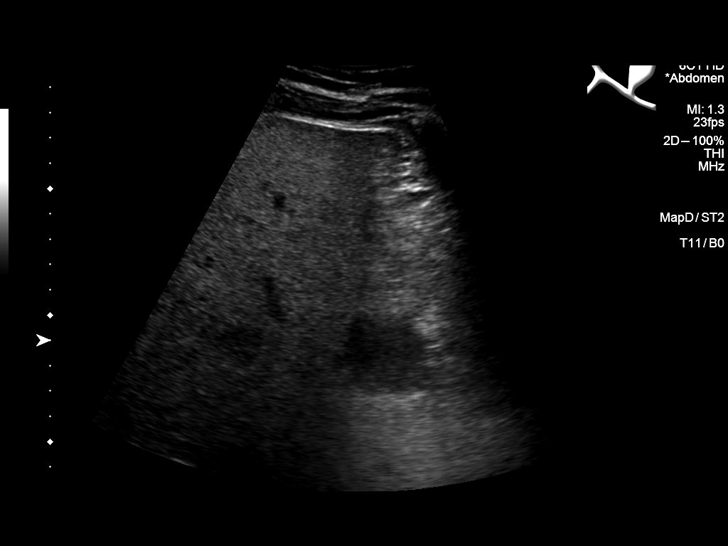
[im 12/46]
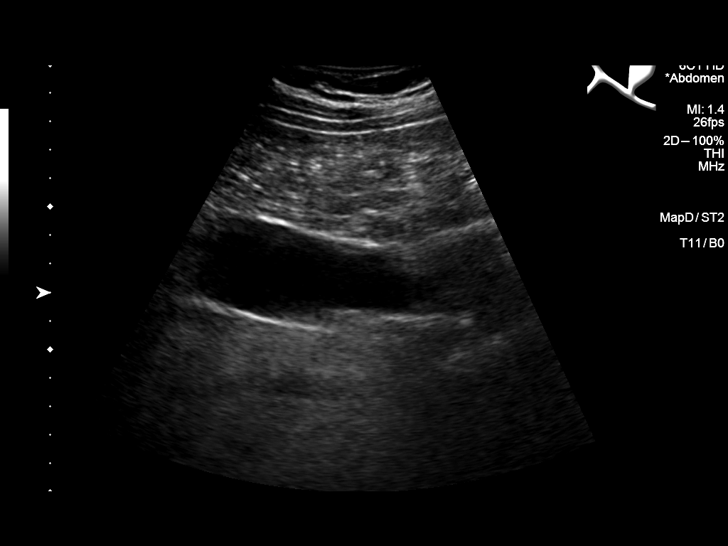
[im 16/46]
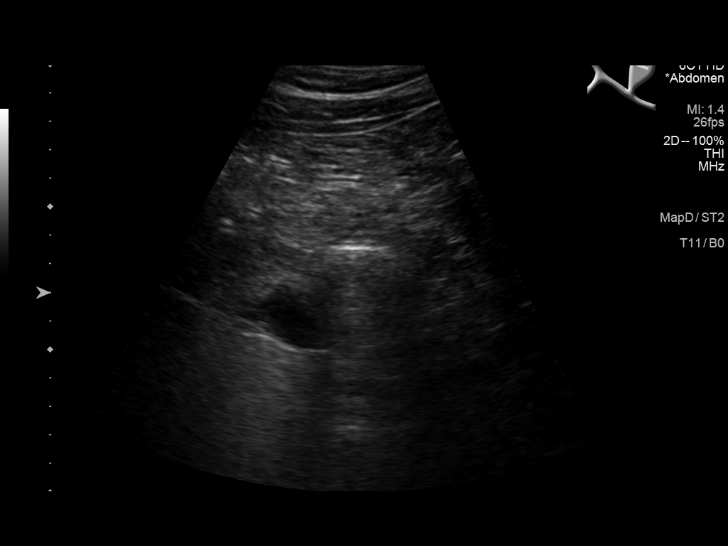
[im 17/46]
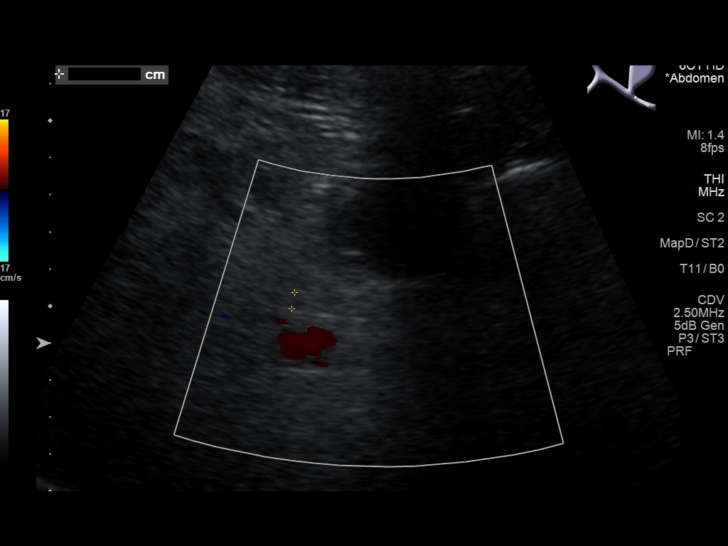
[im 21/46]
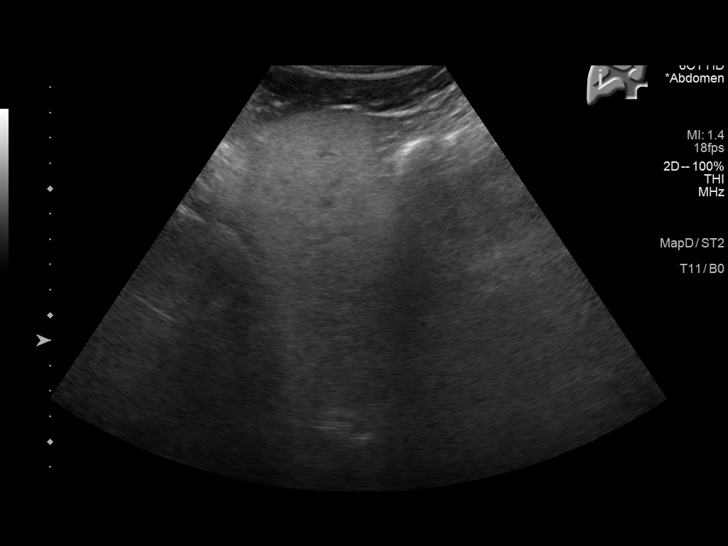
[im 25/46]
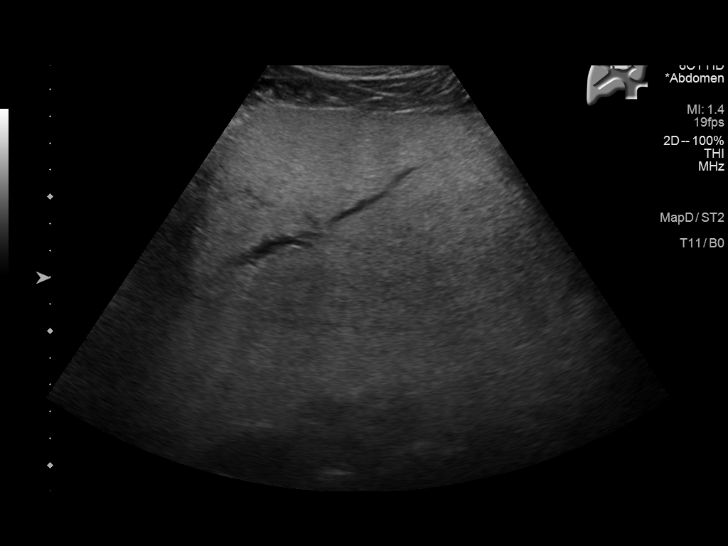
[im 29/46]
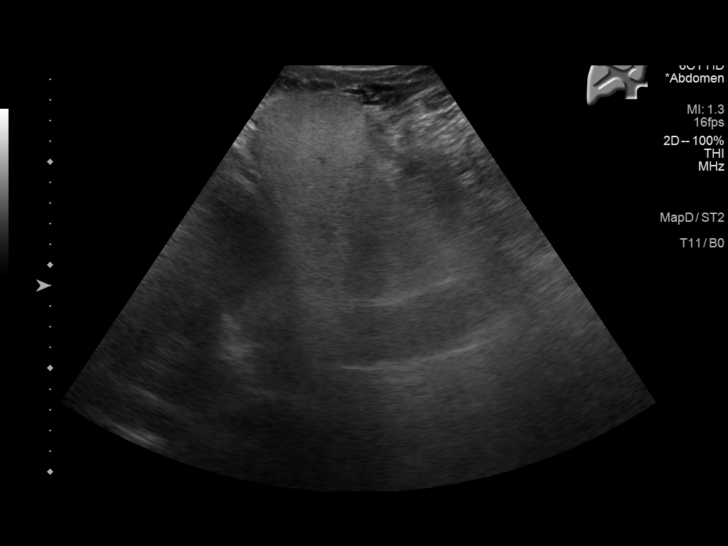
[im 31/46]
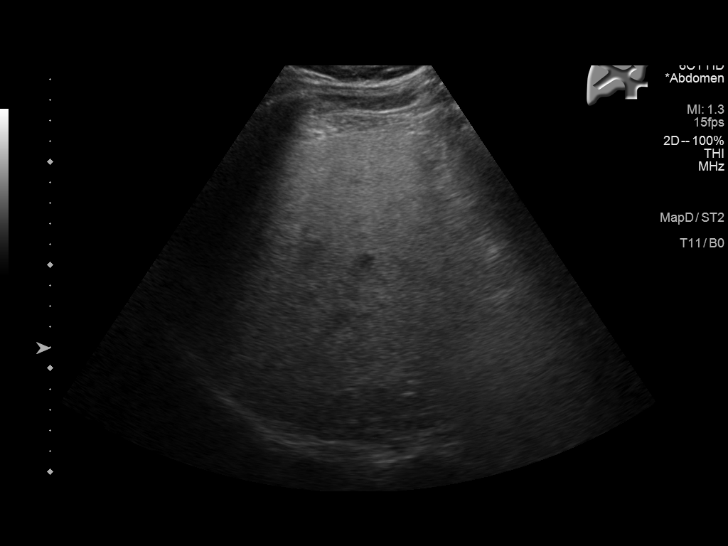
[im 34/46]
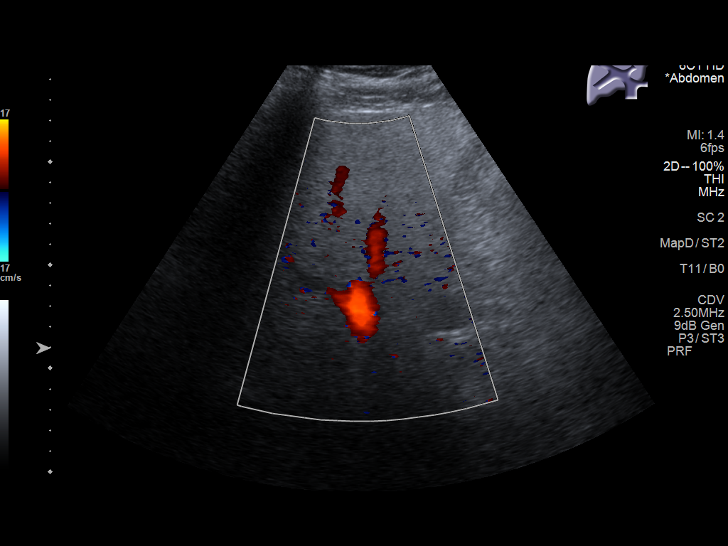
[im 38/46]
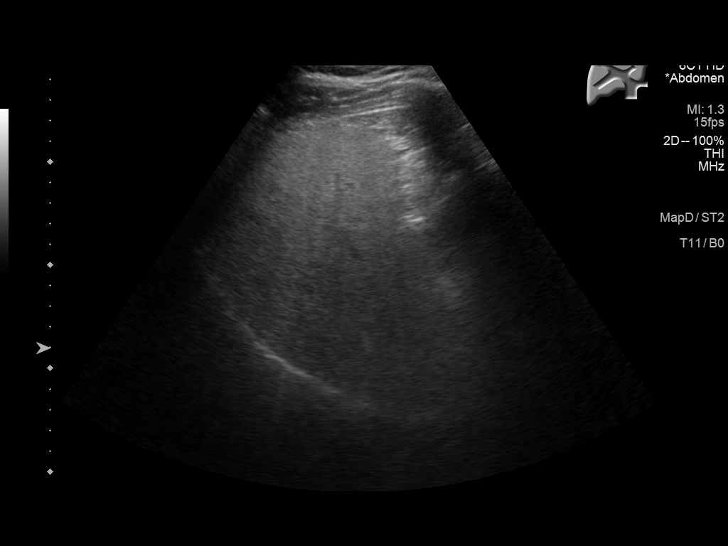
[im 42/46]
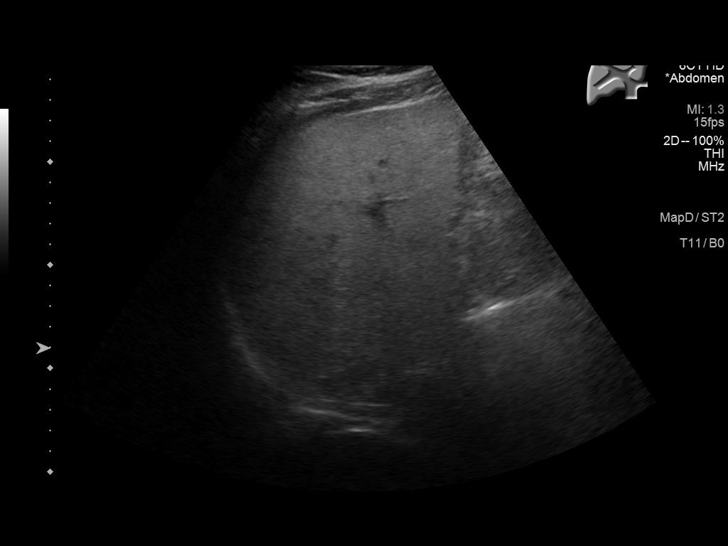
[im 46/46]
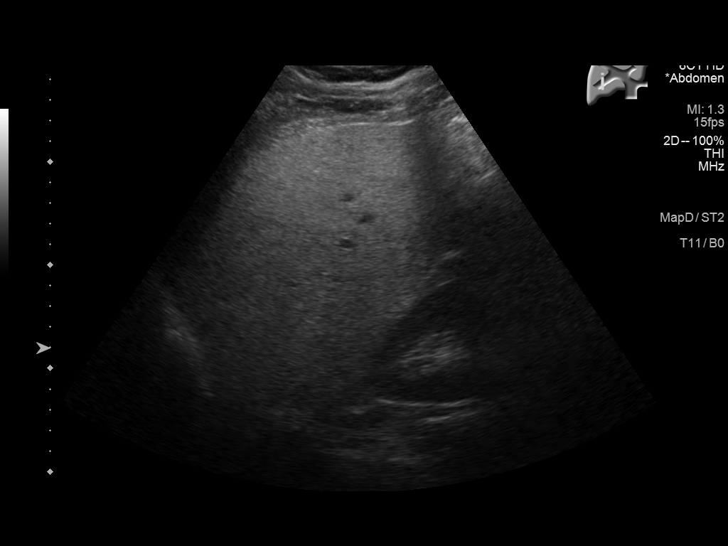

[14 of 25 positions shown; findings below may reference images not displayed]

FINDINGS: Gallbladder:

No gallstones or wall thickening visualized. No sonographic Murphy
sign noted by sonographer.

Common bile duct:

Diameter: 4.5 mm, normal.

Liver:

Diffusely echogenic consistent with steatosis. No focal lesion or
ductal dilatation. Portal vein is patent on color Doppler imaging
with normal direction of blood flow towards the liver.

Other: None.
IMPRESSION: Diffuse steatosis of the liver. No focal lesion or ductal
dilatation. No evidence of gallbladder pathology.

## 2024-02-12 ENCOUNTER — Telehealth: Payer: Self-pay

## 2024-02-12 NOTE — Telephone Encounter (Signed)
 TOC needed / A1c F/U per list pt last saw Flinchum in 2022

## 2024-07-08 ENCOUNTER — Ambulatory Visit
Admission: EM | Admit: 2024-07-08 | Discharge: 2024-07-08 | Disposition: A | Discharge status: Home / Self Care | Payer: Self-pay | Attending: Emergency Medicine | Admitting: Emergency Medicine

## 2024-07-08 ENCOUNTER — Encounter: Payer: Self-pay | Admitting: Emergency Medicine

## 2024-07-08 DIAGNOSIS — J0100 Acute maxillary sinusitis, unspecified: Secondary | ICD-10-CM

## 2024-07-08 DIAGNOSIS — H6502 Acute serous otitis media, left ear: Secondary | ICD-10-CM

## 2024-07-08 MED ORDER — AMOXICILLIN-POT CLAVULANATE 875-125 MG PO TABS: 1.0000 | ORAL_TABLET | Freq: Two times a day (BID) | ORAL | 0 refills | Status: AC

## 2024-07-08 NOTE — Discharge Instructions (Addendum)
 Today you are evaluated for your congestion and ear discomfort  On exam it appears the ear is becoming infected as part of the eardrum his turn red, also having sinus congestion and sinus symptoms with a history of sinus infections therefore you have been started on antibiotic  Take Augmentin  twice daily for 10 days, if you begin to experience any diarrhea May use over-the-counter Imodium or probiotics or take medicine with yogurt to help    You can take Tylenol and/or Ibuprofen as needed for fever reduction and pain relief.   For cough: honey 1/2 to 1 teaspoon (you can dilute the honey in water or another fluid).  You can also use guaifenesin and dextromethorphan for cough. You can use a humidifier for chest congestion and cough.  If you don't have a humidifier, you can sit in the bathroom with the hot shower running.      For sore throat: try warm salt water gargles, cepacol lozenges, throat spray, warm tea or water with lemon/honey, popsicles or ice, or OTC cold relief medicine for throat discomfort.   For congestion: take a daily anti-histamine like Zyrtec, Claritin, and a oral decongestant, such as pseudoephedrine.  You can also use Flonase 1-2 sprays in each nostril daily.   It is important to stay hydrated: drink plenty of fluids (water, gatorade/powerade/pedialyte, juices, or teas) to keep your throat moisturized and help further relieve irritation/discomfort.

## 2024-07-08 NOTE — ED Triage Notes (Signed)
 Patient reports swollen lymph node on left side and reports it hurts on the left side when he swallows. Patient states he feels like he has a sinus infection. Took at home COVID test and results was negative. Denies pain.

## 2024-07-08 NOTE — ED Provider Notes (Signed)
 Michael Lane    CSN: [id number] Arrival date & time: 07/08/24  1151      History   Chief Complaint Chief Complaint  Patient presents with   Nasal Congestion    HPI Michael Lane is a 53 y.o. male.   Patient presents for evaluation of intermittent headaches, left-sided nasal congestion, left-sided swollen lymph nodes, postnasal drip, left ear fullness with 1 occurrence of pain and left-sided sinus pressure with a mild nonproductive cough present for 2 to 3 days.  Progressively worsening.  Endorses a history of reoccurring sinusitis.  Has attempted use of ibuprofen.  Denies fever.  Past Medical History:  Diagnosis Date   Basal cell carcinoma 06/08/2014   right lower eyelid infraorbitial   Basal cell carcinoma 06/22/2021   left mid infraclavicular, [facility]   Hypertension     Patient Active Problem List   Diagnosis Date Noted   Decreased libido 05/29/2019   Acute shoulder bursitis, left 08/26/2017   Health care maintenance 01/16/2015   Diabetes mellitus (HCC) 09/05/2014   Cutaneous skin tags 08/21/2014   Stress 05/01/2014   Skin lesion 05/01/2014   Abnormal liver function tests 09/13/2013   Hypertensive heart disease 09/10/2013   Hyperlipidemia 09/10/2013   Essential hypertension, benign 08/23/2013   Abnormal EKG 08/23/2013   Tobacco abuse 08/23/2013    Past Surgical History:  Procedure Laterality Date   VARICOCELE EXCISION         Home Medications    Prior to Admission medications   Medication Sig Start Date End Date Taking? Authorizing Provider  amoxicillin -clavulanate (AUGMENTIN ) 875-125 MG tablet Take 1 tablet by mouth every 12 (twelve) hours for 10 days. 07/08/24 07/18/24 Yes Bush Murdoch, Shelba SAUNDERS, NP  amLODipine  (NORVASC ) 10 MG tablet TAKE 1 TABLET BY MOUTH EVERY DAY 11/08/22   Glendia Shad, MD  losartan  (COZAAR ) 50 MG tablet TAKE ONE TABLET BY MOUTH DAILY 08/22/20   Glendia Shad, MD    Family History Family History  Problem Relation Age of Onset    Arthritis Mother    Cancer Mother        breast   Breast cancer Mother    Kidney disease Father        H/O kidney stones   Hypertension Father     Social History Social History   Tobacco Use   Smoking status: Every Day    Current packs/day: 1.00    Average packs/day: 1 pack/day for 25.0 years (25.0 ttl pk-yrs)    Types: Cigarettes   Smokeless tobacco: Never   Tobacco comments:    Quit smoking in February 2015  Substance Use Topics   Alcohol use: No    Alcohol/week: 0.0 standard drinks of alcohol   Drug use: Yes    Types: Marijuana    Comment: past     Allergies   Codeine and Shrimp [shellfish allergy]   Review of Systems Review of Systems   Physical Exam Triage Vital Signs ED Triage Vitals  Encounter Vitals Group     BP 07/08/24 1237 (!) 180/111     Girls Systolic BP Percentile --      Girls Diastolic BP Percentile --      Boys Systolic BP Percentile --      Boys Diastolic BP Percentile --      Pulse Rate 07/08/24 1237 92     Resp 07/08/24 1237 20     Temp 07/08/24 1237 98.3 F (36.8 C)     Temp Source 07/08/24 1237 Oral  SpO2 07/08/24 1237 99 %     Weight --      Height --      Head Circumference --      Peak Flow --      Pain Score 07/08/24 1235 0     Pain Loc --      Pain Education --      Exclude from Growth Chart --    No data found.  Updated Vital Signs BP (!) 180/111 (BP Location: Left Arm) Comment: patient not had bp medication in 3 years but was suppose to be taken it.  Pulse 92   Temp 98.3 F (36.8 C) (Oral)   Resp 20   SpO2 99%   Visual Acuity Right Eye Distance:   Left Eye Distance:   Bilateral Distance:    Right Eye Near:   Left Eye Near:    Bilateral Near:     Physical Exam Constitutional:      Appearance: Normal appearance.  HENT:     Right Ear: Tympanic membrane, ear canal and external ear normal.     Left Ear: Ear canal and external ear normal.     Ears:     Comments: Partial erythema to the left tympanic  membrane affecting approximately 50%    Nose: Congestion present.     Left Sinus: Maxillary sinus tenderness present.     Mouth/Throat:     Pharynx: Posterior oropharyngeal erythema present. No oropharyngeal exudate.  Eyes:     Extraocular Movements: Extraocular movements intact.  Cardiovascular:     Rate and Rhythm: Normal rate and regular rhythm.     Pulses: Normal pulses.     Heart sounds: Normal heart sounds.  Pulmonary:     Effort: Pulmonary effort is normal.     Breath sounds: Normal breath sounds.  Neurological:     Mental Status: He is alert and oriented to person, place, and time.      UC Treatments / Results  Labs (all labs ordered are listed, but only abnormal results are displayed) Labs Reviewed - No data to display  EKG   Radiology No results found.  Procedures Procedures (including critical care time)  Medications Ordered in UC Medications - No data to display  Initial Impression / Assessment and Plan / UC Course  I have reviewed the triage vital signs and the nursing notes.  Pertinent labs & imaging results that were available during my care of the patient were reviewed by me and considered in my medical decision making (see chart for details).  Acute nonrecurrent maxillary sinusitis, nonrecurrent acute serous otitis media of left ear, elevated blood pressure reading with diagnosis of hypertension  Patient is in no signs of distress nor toxic appearing.  Vital signs are stable.  Low suspicion for pneumonia, pneumothorax or bronchitis and therefore will defer imaging.  Erythema present to the left tympanic membrane, experiencing congestion and sinus pressure, at this time initiating antibiotics based on the appearance of the ear on exam, prescribed Augmentin .May use additional over-the-counter medications as needed for supportive care.  May follow-up with urgent care as needed if symptoms persist or worsen.  Blood pressure 180/111 in triage, no signs of  hypertensive urgency, prior diagnosis of hypertension, currently taking amlodipine , endorse that 1 time he was taking losartan  but stopped use due to running out of medical insurance, does not want to make adjustments to his medication at this time, given [facility] to [facility] as well as the local health department  for primary care Final Clinical Impressions(s) / UC Diagnoses   Final diagnoses:  Non-recurrent acute serous otitis media of left ear  Acute non-recurrent maxillary sinusitis     Discharge Instructions      Today you are evaluated for your congestion and ear discomfort  On exam it appears the ear is becoming infected as part of the eardrum his turn red, also having sinus congestion and sinus symptoms with a history of sinus infections therefore you have been started on antibiotic  Take Augmentin  twice daily for 10 days, if you begin to experience any diarrhea May use over-the-counter Imodium or probiotics or take medicine with yogurt to help    You can take Tylenol and/or Ibuprofen as needed for fever reduction and pain relief.   For cough: honey 1/2 to 1 teaspoon (you can dilute the honey in water or another fluid).  You can also use guaifenesin and dextromethorphan for cough. You can use a humidifier for chest congestion and cough.  If you don't have a humidifier, you can sit in the bathroom with the hot shower running.      For sore throat: try warm salt water gargles, cepacol lozenges, throat spray, warm tea or water with lemon/honey, popsicles or ice, or OTC cold relief medicine for throat discomfort.   For congestion: take a daily anti-histamine like Zyrtec, Claritin, and a oral decongestant, such as pseudoephedrine.  You can also use Flonase 1-2 sprays in each nostril daily.   It is important to stay hydrated: drink plenty of fluids (water, gatorade/powerade/pedialyte, juices, or teas) to keep your throat moisturized and help further relieve  irritation/discomfort.    ED Prescriptions     Medication Sig Dispense Auth. Provider   amoxicillin -clavulanate (AUGMENTIN ) 875-125 MG tablet Take 1 tablet by mouth every 12 (twelve) hours for 10 days. 20 tablet Freddy Spadafora R, NP      PDMP not reviewed this encounter.   Teresa Shelba SAUNDERS, NP 07/08/24 1416
